# Patient Record
Sex: Male | Born: 1935 | Race: White | Hispanic: No | Marital: Single | Smoking: Current every day smoker
Health system: Southern US, Community
[De-identification: ages and names within clinical notes are randomized; demographics above are authoritative.]

## PROBLEM LIST (undated history)

## (undated) DIAGNOSIS — E785 Hyperlipidemia, unspecified: Secondary | ICD-10-CM

## (undated) DIAGNOSIS — E039 Hypothyroidism, unspecified: Secondary | ICD-10-CM

## (undated) DIAGNOSIS — I2510 Atherosclerotic heart disease of native coronary artery without angina pectoris: Secondary | ICD-10-CM

## (undated) DIAGNOSIS — I10 Essential (primary) hypertension: Secondary | ICD-10-CM

## (undated) DIAGNOSIS — T18128A Food in esophagus causing other injury, initial encounter: Secondary | ICD-10-CM

## (undated) DIAGNOSIS — E119 Type 2 diabetes mellitus without complications: Secondary | ICD-10-CM

## (undated) DIAGNOSIS — K2270 Barrett's esophagus without dysplasia: Secondary | ICD-10-CM

## (undated) HISTORY — PX: FOOT SURGERY: SHX648

## (undated) HISTORY — DX: Essential (primary) hypertension: I10

## (undated) HISTORY — DX: Type 2 diabetes mellitus without complications: E11.9

## (undated) HISTORY — PX: CHOLECYSTECTOMY: SHX55

## (undated) HISTORY — DX: Atherosclerotic heart disease of native coronary artery without angina pectoris: I25.10

## (undated) HISTORY — DX: Hypothyroidism, unspecified: E03.9

## (undated) HISTORY — DX: Barrett's esophagus without dysplasia: K22.70

## (undated) HISTORY — DX: Hyperlipidemia, unspecified: E78.5

## (undated) HISTORY — PX: TONSILLECTOMY: SUR1361

---

## 1991-08-15 HISTORY — PX: CORONARY ARTERY BYPASS GRAFT: SHX141

## 1998-03-10 ENCOUNTER — Inpatient Hospital Stay (HOSPITAL_COMMUNITY): Admission: EM | Admit: 1998-03-10 | Discharge: 1998-03-13 | Payer: Self-pay | Admitting: Emergency Medicine

## 2001-05-16 ENCOUNTER — Encounter: Admission: RE | Admit: 2001-05-16 | Discharge: 2001-08-14 | Payer: Self-pay | Admitting: Internal Medicine

## 2003-01-20 ENCOUNTER — Encounter (HOSPITAL_BASED_OUTPATIENT_CLINIC_OR_DEPARTMENT_OTHER): Admission: RE | Admit: 2003-01-20 | Discharge: 2003-02-10 | Payer: Self-pay | Admitting: Internal Medicine

## 2003-04-30 ENCOUNTER — Encounter (HOSPITAL_BASED_OUTPATIENT_CLINIC_OR_DEPARTMENT_OTHER): Admission: RE | Admit: 2003-04-30 | Discharge: 2003-05-13 | Payer: Self-pay | Admitting: Internal Medicine

## 2003-08-15 HISTORY — PX: COLONOSCOPY W/ POLYPECTOMY: SHX1380

## 2003-08-24 ENCOUNTER — Encounter (HOSPITAL_BASED_OUTPATIENT_CLINIC_OR_DEPARTMENT_OTHER): Admission: RE | Admit: 2003-08-24 | Discharge: 2003-09-01 | Payer: Self-pay | Admitting: Internal Medicine

## 2003-11-24 ENCOUNTER — Encounter (HOSPITAL_BASED_OUTPATIENT_CLINIC_OR_DEPARTMENT_OTHER): Admission: RE | Admit: 2003-11-24 | Discharge: 2003-12-08 | Payer: Self-pay | Admitting: Internal Medicine

## 2004-03-02 ENCOUNTER — Encounter (HOSPITAL_BASED_OUTPATIENT_CLINIC_OR_DEPARTMENT_OTHER): Admission: RE | Admit: 2004-03-02 | Discharge: 2004-03-18 | Payer: Self-pay | Admitting: Internal Medicine

## 2004-06-07 ENCOUNTER — Encounter (HOSPITAL_BASED_OUTPATIENT_CLINIC_OR_DEPARTMENT_OTHER): Admission: RE | Admit: 2004-06-07 | Discharge: 2004-06-20 | Payer: Self-pay | Admitting: Internal Medicine

## 2004-08-26 ENCOUNTER — Ambulatory Visit: Payer: Self-pay | Admitting: Cardiology

## 2004-09-13 ENCOUNTER — Encounter (HOSPITAL_BASED_OUTPATIENT_CLINIC_OR_DEPARTMENT_OTHER): Admission: RE | Admit: 2004-09-13 | Discharge: 2004-09-28 | Payer: Self-pay | Admitting: Internal Medicine

## 2005-05-09 ENCOUNTER — Ambulatory Visit: Payer: Self-pay | Admitting: Internal Medicine

## 2005-05-16 ENCOUNTER — Ambulatory Visit: Payer: Self-pay | Admitting: Internal Medicine

## 2005-06-01 ENCOUNTER — Ambulatory Visit: Payer: Self-pay | Admitting: Cardiology

## 2005-06-06 ENCOUNTER — Ambulatory Visit: Payer: Self-pay

## 2005-08-17 ENCOUNTER — Ambulatory Visit: Payer: Self-pay | Admitting: Cardiology

## 2005-08-21 ENCOUNTER — Ambulatory Visit: Payer: Self-pay | Admitting: Cardiology

## 2005-08-23 ENCOUNTER — Ambulatory Visit: Payer: Self-pay | Admitting: Cardiology

## 2005-08-29 ENCOUNTER — Ambulatory Visit: Payer: Self-pay | Admitting: Cardiology

## 2005-08-29 ENCOUNTER — Inpatient Hospital Stay (HOSPITAL_BASED_OUTPATIENT_CLINIC_OR_DEPARTMENT_OTHER): Admission: RE | Admit: 2005-08-29 | Discharge: 2005-08-29 | Payer: Self-pay | Admitting: Cardiology

## 2005-09-01 ENCOUNTER — Ambulatory Visit: Payer: Self-pay | Admitting: Cardiology

## 2005-09-01 ENCOUNTER — Ambulatory Visit (HOSPITAL_COMMUNITY): Admission: RE | Admit: 2005-09-01 | Discharge: 2005-09-02 | Payer: Self-pay | Admitting: Cardiology

## 2005-09-21 ENCOUNTER — Encounter (HOSPITAL_COMMUNITY): Admission: RE | Admit: 2005-09-21 | Discharge: 2005-12-20 | Payer: Self-pay | Admitting: Cardiology

## 2006-06-21 ENCOUNTER — Ambulatory Visit: Payer: Self-pay | Admitting: Internal Medicine

## 2006-06-21 LAB — CONVERTED CEMR LAB
ALT: 18 units/L (ref 0–40)
HDL: 37.7 mg/dL — ABNORMAL LOW (ref >39.0)
Hgb A1c MFr Bld: 6.8 % — ABNORMAL HIGH (ref 4.6–6.0)
Potassium: 3.8 meq/L (ref 3.5–5.1)
Triglyceride fasting, serum: 126 mg/dL (ref 0–149)

## 2006-07-02 ENCOUNTER — Ambulatory Visit: Payer: Self-pay | Admitting: Cardiology

## 2006-08-13 ENCOUNTER — Ambulatory Visit: Payer: Self-pay | Admitting: Cardiology

## 2006-08-14 HISTORY — PX: ANGIOPLASTY: SHX39

## 2007-04-26 ENCOUNTER — Ambulatory Visit: Payer: Self-pay | Admitting: Internal Medicine

## 2007-04-26 DIAGNOSIS — E785 Hyperlipidemia, unspecified: Secondary | ICD-10-CM

## 2007-04-26 DIAGNOSIS — I2510 Atherosclerotic heart disease of native coronary artery without angina pectoris: Secondary | ICD-10-CM | POA: Insufficient documentation

## 2007-05-15 ENCOUNTER — Encounter: Payer: Self-pay | Admitting: Internal Medicine

## 2007-06-03 ENCOUNTER — Encounter: Payer: Self-pay | Admitting: Internal Medicine

## 2007-09-10 ENCOUNTER — Encounter: Payer: Self-pay | Admitting: Internal Medicine

## 2007-12-06 ENCOUNTER — Ambulatory Visit: Payer: Self-pay | Admitting: Internal Medicine

## 2007-12-06 ENCOUNTER — Inpatient Hospital Stay (HOSPITAL_COMMUNITY): Admission: EM | Admit: 2007-12-06 | Discharge: 2007-12-16 | Payer: Self-pay | Admitting: Emergency Medicine

## 2007-12-11 ENCOUNTER — Encounter (INDEPENDENT_AMBULATORY_CARE_PROVIDER_SITE_OTHER): Payer: Self-pay | Admitting: General Surgery

## 2008-01-02 ENCOUNTER — Encounter: Payer: Self-pay | Admitting: Internal Medicine

## 2008-01-16 ENCOUNTER — Encounter: Payer: Self-pay | Admitting: Internal Medicine

## 2008-01-21 ENCOUNTER — Telehealth (INDEPENDENT_AMBULATORY_CARE_PROVIDER_SITE_OTHER): Payer: Self-pay | Admitting: *Deleted

## 2008-01-27 ENCOUNTER — Ambulatory Visit: Payer: Self-pay | Admitting: Family Medicine

## 2008-01-27 DIAGNOSIS — E039 Hypothyroidism, unspecified: Secondary | ICD-10-CM | POA: Insufficient documentation

## 2008-01-27 LAB — HM DIABETES FOOT EXAM

## 2008-01-30 ENCOUNTER — Ambulatory Visit: Payer: Self-pay | Admitting: Internal Medicine

## 2008-02-03 ENCOUNTER — Telehealth (INDEPENDENT_AMBULATORY_CARE_PROVIDER_SITE_OTHER): Payer: Self-pay | Admitting: *Deleted

## 2008-02-03 LAB — CONVERTED CEMR LAB
AST: 19 units/L (ref 0–37)
BUN: 13 mg/dL (ref 6–23)
Calcium: 9.5 mg/dL (ref 8.4–10.5)
Creatinine, Ser: 1 mg/dL (ref 0.4–1.5)
Glucose, Bld: 100 mg/dL — ABNORMAL HIGH (ref 70–99)
HDL: 32.7 mg/dL — ABNORMAL LOW (ref >39.0)
Potassium: 4.6 meq/L (ref 3.5–5.1)
Sodium: 141 meq/L (ref 135–145)
TSH: 18.2 microintl units/mL — ABNORMAL HIGH (ref 0.35–5.50)
Total CHOL/HDL Ratio: 9
Total Protein: 7.3 g/dL (ref 6.0–8.3)

## 2008-03-29 ENCOUNTER — Encounter: Payer: Self-pay | Admitting: Internal Medicine

## 2008-04-02 ENCOUNTER — Ambulatory Visit: Payer: Self-pay | Admitting: Internal Medicine

## 2008-04-02 DIAGNOSIS — L719 Rosacea, unspecified: Secondary | ICD-10-CM | POA: Insufficient documentation

## 2008-04-06 ENCOUNTER — Encounter (INDEPENDENT_AMBULATORY_CARE_PROVIDER_SITE_OTHER): Payer: Self-pay | Admitting: *Deleted

## 2008-04-06 LAB — CONVERTED CEMR LAB
ALT: 16 units/L (ref 0–53)
AST: 17 units/L (ref 0–37)
Bilirubin, Direct: 0.1 mg/dL (ref 0.0–0.3)
HDL: 37.1 mg/dL — ABNORMAL LOW (ref >39.0)
TSH: 23.85 microintl units/mL — ABNORMAL HIGH (ref 0.35–5.50)
Total Bilirubin: 0.5 mg/dL (ref 0.3–1.2)
Triglycerides: 155 mg/dL — ABNORMAL HIGH (ref 0–149)
VLDL: 31 mg/dL (ref 0–40)

## 2008-04-07 ENCOUNTER — Encounter: Payer: Self-pay | Admitting: Internal Medicine

## 2008-04-21 ENCOUNTER — Encounter: Payer: Self-pay | Admitting: Internal Medicine

## 2008-06-18 ENCOUNTER — Encounter: Payer: Self-pay | Admitting: Internal Medicine

## 2008-07-14 ENCOUNTER — Telehealth (INDEPENDENT_AMBULATORY_CARE_PROVIDER_SITE_OTHER): Payer: Self-pay | Admitting: *Deleted

## 2008-07-31 ENCOUNTER — Encounter: Payer: Self-pay | Admitting: Internal Medicine

## 2008-08-11 ENCOUNTER — Ambulatory Visit: Payer: Self-pay | Admitting: Internal Medicine

## 2008-08-18 ENCOUNTER — Encounter (INDEPENDENT_AMBULATORY_CARE_PROVIDER_SITE_OTHER): Payer: Self-pay | Admitting: *Deleted

## 2008-08-18 LAB — CONVERTED CEMR LAB
BUN: 19 mg/dL (ref 6–23)
Creatinine, Ser: 1.1 mg/dL (ref 0.4–1.5)

## 2008-09-08 ENCOUNTER — Ambulatory Visit: Payer: Self-pay | Admitting: Internal Medicine

## 2008-09-08 DIAGNOSIS — E1165 Type 2 diabetes mellitus with hyperglycemia: Secondary | ICD-10-CM

## 2008-10-27 ENCOUNTER — Ambulatory Visit: Payer: Self-pay | Admitting: Internal Medicine

## 2008-11-02 ENCOUNTER — Encounter (INDEPENDENT_AMBULATORY_CARE_PROVIDER_SITE_OTHER): Payer: Self-pay | Admitting: *Deleted

## 2008-11-02 LAB — CONVERTED CEMR LAB: TSH: 2.69 microintl units/mL (ref 0.35–5.50)

## 2008-11-03 ENCOUNTER — Ambulatory Visit: Payer: Self-pay | Admitting: Internal Medicine

## 2008-11-04 ENCOUNTER — Telehealth (INDEPENDENT_AMBULATORY_CARE_PROVIDER_SITE_OTHER): Payer: Self-pay | Admitting: *Deleted

## 2008-11-17 ENCOUNTER — Encounter: Payer: Self-pay | Admitting: Internal Medicine

## 2008-12-14 ENCOUNTER — Telehealth (INDEPENDENT_AMBULATORY_CARE_PROVIDER_SITE_OTHER): Payer: Self-pay | Admitting: *Deleted

## 2009-01-06 ENCOUNTER — Encounter: Payer: Self-pay | Admitting: Internal Medicine

## 2009-01-12 ENCOUNTER — Encounter: Payer: Self-pay | Admitting: Internal Medicine

## 2009-03-05 ENCOUNTER — Telehealth (INDEPENDENT_AMBULATORY_CARE_PROVIDER_SITE_OTHER): Payer: Self-pay | Admitting: *Deleted

## 2009-03-08 ENCOUNTER — Encounter: Payer: Self-pay | Admitting: Internal Medicine

## 2009-04-21 ENCOUNTER — Encounter: Payer: Self-pay | Admitting: Internal Medicine

## 2009-04-23 ENCOUNTER — Encounter: Payer: Self-pay | Admitting: Internal Medicine

## 2009-09-01 ENCOUNTER — Encounter: Payer: Self-pay | Admitting: Internal Medicine

## 2009-09-21 ENCOUNTER — Encounter: Payer: Self-pay | Admitting: Internal Medicine

## 2009-09-24 ENCOUNTER — Ambulatory Visit: Payer: Self-pay | Admitting: Internal Medicine

## 2009-09-24 DIAGNOSIS — R079 Chest pain, unspecified: Secondary | ICD-10-CM

## 2009-09-24 DIAGNOSIS — Z860100 Personal history of colon polyps, unspecified: Secondary | ICD-10-CM | POA: Insufficient documentation

## 2009-09-24 DIAGNOSIS — F17200 Nicotine dependence, unspecified, uncomplicated: Secondary | ICD-10-CM

## 2009-09-28 ENCOUNTER — Encounter (INDEPENDENT_AMBULATORY_CARE_PROVIDER_SITE_OTHER): Payer: Self-pay | Admitting: *Deleted

## 2009-10-21 ENCOUNTER — Ambulatory Visit: Payer: Self-pay | Admitting: Cardiology

## 2009-10-21 DIAGNOSIS — E663 Overweight: Secondary | ICD-10-CM | POA: Insufficient documentation

## 2009-10-26 ENCOUNTER — Telehealth (INDEPENDENT_AMBULATORY_CARE_PROVIDER_SITE_OTHER): Payer: Self-pay | Admitting: *Deleted

## 2009-10-26 ENCOUNTER — Encounter: Payer: Self-pay | Admitting: Internal Medicine

## 2009-10-27 ENCOUNTER — Encounter (HOSPITAL_COMMUNITY): Admission: RE | Admit: 2009-10-27 | Discharge: 2010-01-12 | Payer: Self-pay | Admitting: Cardiology

## 2009-10-27 ENCOUNTER — Ambulatory Visit: Payer: Self-pay

## 2009-10-27 ENCOUNTER — Ambulatory Visit: Payer: Self-pay | Admitting: Cardiology

## 2009-11-01 ENCOUNTER — Ambulatory Visit: Payer: Self-pay | Admitting: Internal Medicine

## 2009-12-23 ENCOUNTER — Encounter: Payer: Self-pay | Admitting: Internal Medicine

## 2009-12-30 ENCOUNTER — Encounter: Payer: Self-pay | Admitting: Internal Medicine

## 2010-01-05 ENCOUNTER — Encounter: Payer: Self-pay | Admitting: Internal Medicine

## 2010-01-07 ENCOUNTER — Ambulatory Visit: Payer: Self-pay | Admitting: Internal Medicine

## 2010-01-08 LAB — CONVERTED CEMR LAB
AST: 21 units/L (ref 0–37)
Alkaline Phosphatase: 65 units/L (ref 39–117)
Bilirubin, Direct: 0.1 mg/dL (ref 0.0–0.3)
Cholesterol: 169 mg/dL (ref 0–200)
HDL: 39.1 mg/dL (ref >39.00)
VLDL: 32.4 mg/dL (ref 0.0–40.0)

## 2010-01-13 ENCOUNTER — Ambulatory Visit: Payer: Self-pay | Admitting: Internal Medicine

## 2010-04-07 ENCOUNTER — Encounter: Payer: Self-pay | Admitting: Internal Medicine

## 2010-05-24 ENCOUNTER — Encounter: Payer: Self-pay | Admitting: Internal Medicine

## 2010-05-26 ENCOUNTER — Encounter: Payer: Self-pay | Admitting: Internal Medicine

## 2010-06-03 ENCOUNTER — Encounter: Payer: Self-pay | Admitting: Internal Medicine

## 2010-06-27 ENCOUNTER — Encounter: Payer: Self-pay | Admitting: Internal Medicine

## 2010-07-20 ENCOUNTER — Encounter: Payer: Self-pay | Admitting: Internal Medicine

## 2010-07-25 ENCOUNTER — Encounter: Payer: Self-pay | Admitting: Internal Medicine

## 2010-08-09 ENCOUNTER — Emergency Department (HOSPITAL_COMMUNITY)
Admission: EM | Admit: 2010-08-09 | Discharge: 2010-08-09 | Payer: Self-pay | Source: Home / Self Care | Admitting: Emergency Medicine

## 2010-08-10 ENCOUNTER — Ambulatory Visit
Admission: RE | Admit: 2010-08-10 | Discharge: 2010-08-10 | Payer: Self-pay | Source: Home / Self Care | Attending: Internal Medicine | Admitting: Internal Medicine

## 2010-08-10 DIAGNOSIS — R259 Unspecified abnormal involuntary movements: Secondary | ICD-10-CM | POA: Insufficient documentation

## 2010-09-11 LAB — CONVERTED CEMR LAB
AST: 25 units/L (ref 0–37)
BUN: 21 mg/dL (ref 6–23)
Basophils Absolute: 0.1 10*3/uL (ref 0.0–0.1)
Eosinophils Relative: 2 % (ref 0–5)
Hemoglobin: 13.7 g/dL (ref 13.0–17.0)
Hgb A1c MFr Bld: 7.5 % — ABNORMAL HIGH (ref 4.6–6.1)
Lymphocytes Relative: 27 % (ref 12–46)
Lymphs Abs: 1.8 10*3/uL (ref 0.7–4.0)
Monocytes Absolute: 0.6 10*3/uL (ref 0.1–1.0)
Monocytes Relative: 9 % (ref 3–12)
Neutrophils Relative %: 61 % (ref 43–77)
TSH: 6.095 microintl units/mL — ABNORMAL HIGH (ref 0.350–4.500)
Triglycerides: 251 mg/dL — ABNORMAL HIGH (ref <150)

## 2010-09-15 NOTE — Medication Information (Signed)
Summary: [facility]   Imported By: Lanelle Bal 09/30/2009 09:46:03  _____________________________________________________________________  External Attachment:    Type:   Image     Comment:   External Document

## 2010-09-15 NOTE — Miscellaneous (Signed)
Summary: Care Plan/[facility]   Imported By: Lanelle Bal 08/04/2010 08:48:37  _____________________________________________________________________  External Attachment:    Type:   Image     Comment:   External Document

## 2010-09-15 NOTE — Progress Notes (Signed)
Summary: Nuclear Pre-Procedure  Phone Note Outgoing Call   Call placed by: Milana Na, EMT-P,  October 26, 2009 1:10 PM Summary of Call: Reviewed information on Myoview Information Sheet (see scanned document for further details).  Spoke with patient.     Nuclear Med Background Indications for Stress Test: Evaluation for Ischemia, Graft Patency   History: Angioplasty, CABG, Heart Catheterization, Myocardial Perfusion Study  History Comments: '93  CABG x 2 10/06 MPS EF 64% ? Anterior  ischemia '07 Angioplasty/ Heart Cath EF 65%  Symptoms: Chest Pain, Chest Pain with Exertion    Nuclear Pre-Procedure Cardiac Risk Factors: Lipids, Smoker Height (in): 70  Nuclear Med Study 1 or 2 day study:  1 day     Referring MD:  J.Hochrein

## 2010-09-15 NOTE — Miscellaneous (Signed)
Summary: Diet Order/[facility]  Diet Order/[facility]   Imported By: Lanelle Bal 06/13/2010 14:19:20  _____________________________________________________________________  External Attachment:    Type:   Image     Comment:   External Document

## 2010-09-15 NOTE — Letter (Signed)
Summary: [facility]  [facility]   Imported By: Lanelle Bal 06/06/2010 14:26:54  _____________________________________________________________________  External Attachment:    Type:   Image     Comment:   External Document

## 2010-09-15 NOTE — Letter (Signed)
Summary: [facility]   Imported By: Lanelle Bal 09/30/2009 08:09:17  _____________________________________________________________________  External Attachment:    Type:   Image     Comment:   External Document

## 2010-09-15 NOTE — Miscellaneous (Signed)
Summary: FL2/[facility]   Imported By: Lanelle Bal 06/13/2010 14:17:09  _____________________________________________________________________  External Attachment:    Type:   Image     Comment:   External Document

## 2010-09-15 NOTE — Assessment & Plan Note (Signed)
Summary: f/u lab work//lch   Vital Signs:  Patient profile:   76 year old male Weight:      210.2 pounds Pulse rate:   64 / minute Resp:     18 per minute BP sitting:   118 / 66  (left arm) Cuff size:   large  Vitals Entered By: Shonna Chock (January 13, 2010 10:13 AM) CC: Follow-up on Labs (Copy Given) Comments REVIEWED MED LIST, PATIENT AGREED DOSE AND INSTRUCTION CORRECT    Primary Care Provider:  Marga Melnick MD  CC:  Follow-up on Labs (Copy Given).  History of Present Illness: FBS 140-170; rare  2 hr post meal up to 240. No hypoglycemia; weight stable.No diet ; occasioanal soft drinks. Smoking 4-6 / day; minimal CVE.Labs reviewed & risks discussed; Lipids are essentially @ goal but DM poorly controlled with A1c 7.9% which is average sugar of 180 & 58% increased cardiovascular  risk. Dr Hochrein's 03/11 OV & stress test reviewed: no ischemic changes  found.   Allergies (verified): No Known Drug Allergies  Review of Systems General:  Denies fatigue and weight loss. Eyes:  Denies blurring, double vision, and vision loss-both eyes; No Ophth exam > 1+ years. CV:  Denies chest pain or discomfort, leg cramps with exertion, lightheadness, and near fainting. Derm:  Denies poor wound healing; Podiatrist seen every 2 months. Neuro:  Denies disturbances in coordination, numbness, poor balance, and tingling. Endo:  Denies excessive hunger, excessive thirst, and excessive urination.  Physical Exam  General:  in no acute distress; alert,appropriate and cooperative throughout examination Ears:  External ear exam shows no significant lesions or deformities.  Hearing is grossly decreased  bilaterally. Aid worn on R Lungs:  Normal respiratory effort, chest expands symmetrically. Lungs are clear to auscultation, no crackles or wheezes. Heart:  regular rhythm, no gallop, no rub, no JVD, bradycardia, and grade 1 /6 systolic murmur.   Pulses:  R and L carotid,radial,dorsalis pedis and posterior  tibial pulses are full and equal bilaterally Extremities:  No clubbing, cyanosis  noted . Chronic toe  deformities LLE > RLE with toenail fungal changes Neurologic:  alert & oriented X3 and sensation intact to light touch over feet   Skin:  Intact without suspicious lesions or rashes Cervical Nodes:  No lymphadenopathy noted Axillary Nodes:  No palpable lymphadenopathy Psych:  memory intact for recent and remote, normally interactive, and good eye contact.     Impression & Recommendations:  Problem # 1:  DIABETES MELLITUS, TYPE II, UNCONTROLLED (ICD-250.02) Role of HFCS "sugar" discussed His updated medication list for this problem includes:    Glimepiride 2 Mg Tabs (Glimepiride) .Marland Kitchen... 1 by mouth once daily    Metformin Hcl 1000 Mg Tabs (Metformin hcl) .Marland Kitchen... 1 by mouth bid    Benazepril Hcl 20 Mg Tabs (Benazepril hcl) .Marland Kitchen... Take one tablet by mouth daily  Problem # 2:  HYPERLIPIDEMIA NEC/NOS (ICD-272.4) Good control His updated medication list for this problem includes:    Simvastatin 40 Mg Tabs (Simvastatin) .Marland Kitchen... 1 at bedtime in place of pravastatin  Problem # 3:  HYPOTHYROIDISM (ICD-244.9) TSH therapeutic His updated medication list for this problem includes:    Synthroid 100 Mcg Tabs (Levothyroxine sodium) .Marland Kitchen... 1 by mouth once daily except 1& 1/2 every weds  Problem # 4:  C A D (ICD-414.00) Stable His updated medication list for this problem includes:    Atenolol 25 Mg Tabs (Atenolol) .Marland Kitchen... Take one tablet daily    Benazepril Hcl 20 Mg  Tabs (Benazepril hcl) .Marland Kitchen... Take one tablet by mouth daily  Complete Medication List: 1)  Glimepiride 2 Mg Tabs (Glimepiride) .Marland Kitchen.. 1 by mouth once daily 2)  Metformin Hcl 1000 Mg Tabs (Metformin hcl) .Marland Kitchen.. 1 by mouth bid 3)  Atenolol 25 Mg Tabs (Atenolol) .... Take one tablet daily 4)  Benazepril Hcl 20 Mg Tabs (Benazepril hcl) .... Take one tablet by mouth daily 5)  Synthroid 100 Mcg Tabs (Levothyroxine sodium) .Marland Kitchen.. 1 by mouth once daily  except 1& 1/2 every weds 6)  Metrogel 1 % Gel (Metronidazole) .... Apply o rash once daily after cleansing; patient may apply 7)  Pancrease Mt 4 07-18-11 Mu Cpep (Amylase-lipase-protease) .Marland Kitchen.. 1 pill before breakfast to prevent diarrhea 8)  Simvastatin 40 Mg Tabs (Simvastatin) .Marland Kitchen.. 1 at bedtime in place of pravastatin  Patient Instructions: 1)  Consume LESS THAN 40 grams of High Fructose Corn Syrup "sugar"/ day  if you want to avoid increase in meds or even insulin as discussed. 2)  Please schedule a follow-up appointment in 3 months. 3)  BUN,creat, K+ prior to visit, ICD-9: 4)  HbgA1C prior to visit, ICD-9: 5)  Urine Microalbumin prior to visit, ICD-9:

## 2010-09-15 NOTE — Miscellaneous (Signed)
Summary: Diet Order/[facility]  Diet Order/[facility]   Imported By: Lanelle Bal 01/06/2010 12:14:21  _____________________________________________________________________  External Attachment:    Type:   Image     Comment:   External Document

## 2010-09-15 NOTE — Letter (Signed)
Summary: Primary Care Consult Scheduled Letter  [facility] [place]  [address] [place], [place] [address]   Phone: [phone]  Fax: [phone]      09/28/2009 MRN: [id number]  [address] [place], [place]  [address]    Dear Mr. TONY,    We have scheduled an appointment for you.  At the recommendation of Dr. Marga Melnick, we have scheduled you a consult with Dr. Rollene Rotunda with [facility] on 10-21-2009 at 4:15pm.  Their address is [address], [address] floor, [place] [address]. The office phone number is [phone].  If this appointment day and time is not convenient for you, please feel free to call the office of the doctor you are being referred to at the number listed above and reschedule the appointment.    It is important for you to keep your scheduled appointments. We are here to make sure you are given good patient care.   Thank you,    Renee, Patient Care Coordinator [facility] [place]    **IF YOU ARE UNABLE TO KEEP THIS APPOINTMENT, OR NEED TO RESCHEDULE, PLEASE GIVE A 24 HOUR NOTICE TO AVOID A $50 FEE**

## 2010-09-15 NOTE — Assessment & Plan Note (Signed)
Summary: f3y/chest pain   Visit Type:  Follow-up Primary Provider:  Marga Melnick MD  CC:  chest pain .  History of Present Illness: The patient presents after being absent from this clinic for over 3 years. He has a history of coronary disease status post bypass grafting. Her last catheterization was in 2007 at which point he had cutting balloon angioplasty. Since that time he had not had any problems until about a year ago. He started noticing that with ambulation he would have some discomfort in his lower chest upper epigastric area. It is not clear that this was similar to previous angina. However, it was reproducible with exertion. He would stop when he was rested. He was not getting these symptoms without exertion. He did not describe radiation to his jaw or to his arms. He did not have associated symptoms such as nausea vomiting or diaphoresis. He's had no palpitations, presyncope or syncope.  Of note he says he had a significant episode of hypoglycemia and unresponsiveness about 2 years ago requiring that he moved to assisted living though he is quite independent there.  Current Medications (verified): 1)  Glimepiride 2 Mg Tabs (Glimepiride) .Marland Kitchen.. 1 By Mouth Once Daily 2)  Metformin Hcl 1000 Mg Tabs (Metformin Hcl) .Marland Kitchen.. 1 By Mouth Bid 3)  Atenolol 25 Mg  Tabs (Atenolol) .... Take One Tablet Daily 4)  Benazepril Hcl 20 Mg  Tabs (Benazepril Hcl) .... Take One Tablet By Mouth Daily 5)  Synthroid 100 Mcg Tabs (Levothyroxine Sodium) .Marland Kitchen.. 1 By Mouth Once Daily Except 1& 1/2 Every Weds 6)  Metrogel 1 % Gel (Metronidazole) .... Apply O Rash Once Daily After Cleansing; Patient May Apply 7)  Pancrease Mt 4 07-18-11 Mu Cpep (Amylase-Lipase-Protease) .Marland Kitchen.. 1 Pill Before Breakfast To Prevent Diarrhea 8)  Simvastatin 40 Mg Tabs (Simvastatin) .Marland Kitchen.. 1 At Bedtime in Place of Pravastatin  Allergies (verified): No Known Drug Allergies  Past History:  Past Medical History: Reviewed history from  10/21/2009 and no changes required. HYPERLIPIDEMIA NEC/NOS (ICD-272.4) DIABETES-TYPE 2 (ICD-250.00) C A D (ICD-414.00)(LAD  occluded and seen to fill via the LIMA, LAD was free of high-grade disease,  diagonal was occluded at the ostium and seen to fill the other vein graft,  the circumflex was occluded proximally at the left main. There was a large  branching mid obtuse that was noted to fill the vein graft. The inferior  branch which looked to run in  AV groove had a proximal 95% stenosis. The  right coronary had a long proximal 60% stenosis. There was a mid 99%  followed by a total occlusion of the RV branch. There was a large PDA filled  via collaterals from the LAD. The LIMA to the LAD was patent, saphenous vein  graft to the coronary artery was occluded at the ostium, saphenous vein  graft to the obtuse marginal had diffuse liminal irregularities with a 99%  anastomotic lesion. This was treated with cutting balloon. The saphenous  vein graft to diagonal had a hazy area with about a 50% stenosis. The EF was  65%) Hypothyroidism Colonic polyps, hx of Congenital L foot deformities  Family History: Reviewed history from 09/24/2009 and no changes required. Father: Macular Degeneration , CABG X 2 Mother: Alzeimer's Siblings: bro +/- DM  Social History: No diet Retired Single Current Smoker Alcohol use-yes:occasionally  Review of Systems       Positive for decreased hearing. Otherwise as stated in the history of present illness negative for all other systems.  Vital Signs:  Patient profile:   76 year old male Height:      70 inches Weight:      209 pounds BMI:     30.10 Pulse rate:   70 / minute Resp:     16 per minute BP sitting:   124 / 63  (left arm)  Vitals Entered By: Burnett Kanaris, CNA (October 21, 2009 4:27 PM)  Physical Exam  General:  Well developed, well nourished, in no acute distress. Head:  normocephalic and atraumatic Eyes:  PERRLA/EOM intact;  conjunctiva and lids normal. Mouth:  Teeth, gums and palate normal. Oral mucosa normal. Neck:  Neck supple, no JVD. No masses, thyromegaly or abnormal cervical nodes. Chest Wall:  Well-healed sternotomy scar Lungs:  Clear bilaterally to auscultation and percussion. Abdomen:  Bowel sounds positive; abdomen soft and non-tender without masses, organomegaly, or hernias noted. No hepatosplenomegaly, obese Msk:  Back normal, normal gait. Muscle strength and tone normal. Extremities:  No clubbing or cyanosis, left foot with congenital deformities Neurologic:  Alert and oriented x 3. Skin:  Intact without lesions or rashes. Cervical Nodes:  no significant adenopathy Inguinal Nodes:  no significant adenopathy Psych:  Normal affect.   Detailed Cardiovascular Exam  Neck    Carotids: Carotids full and equal bilaterally without bruits.      Neck Veins: Normal, no JVD.    Heart    Inspection: no deformities or lifts noted.      Palpation: normal PMI with no thrills palpable.      Auscultation: regular rate and rhythm, S1, S2 without murmurs, rubs, gallops, or clicks.    Vascular    Abdominal Aorta: no palpable masses, pulsations, or audible bruits.      Femoral Pulses: normal femoral pulses bilaterally.      Pedal Pulses: normal pedal pulses bilaterally.      Radial Pulses: normal radial pulses bilaterally.      Peripheral Circulation: no clubbing, cyanosis, or edema noted with normal capillary refill.     Impression & Recommendations:  Problem # 1:  CHEST PAIN (ICD-786.50) The patient has a long history of coronary disease as described. It has been several years since his last evaluation. He has recurrent symptoms which could be his angina. He would find it difficult to walk on a treadmill. Therefore, I will schedule an adenosine perfusion study. Further evaluation will be based on these results. Orders: EKG w/ Interpretation (93000) Nuclear Stress Test (Nuc Stress Test)  Problem # 2:   SMOKER (ICD-305.1) Dr. Alwyn Ren recently spent several minutes discussing the need to stop smoking with this patient. He understands how important this is.  Problem # 3:  HYPERLIPIDEMIA NEC/NOS (ICD-272.4) I reviewed his lipid profile with him. His HDL is slightly below 40, LDL is in the 80s. I asked him to increase his exercise to improve his HDL. He will continue the other meds as listed.  Problem # 4:  OVERWEIGHT (ICD-278.02) I discussed with him the need to lose weight.  Patient Instructions: 1)  Your physician wants you to follow-up in: 1 year.  You will receive a reminder letter in the mail two months in advance. If you don't receive a letter, please call our office to schedule the follow-up appointment. 2)  Your physician has requested that you have an adenosine myoview.  For further information please visit [email or web address]  Please follow instruction sheet, as given.  Appended Document: f3y/chest pain No evidence of ischemia.

## 2010-09-15 NOTE — Assessment & Plan Note (Signed)
Summary: COLD SYMPTOMS/SWOLLEN GLAND/KN   Vital Signs:  Patient profile:   76 year old male Weight:      206.2 pounds BMI:     29.69 O2 Sat:      95 % on Room air Temp:     97.6 degrees F oral Pulse rate:   76 / minute Resp:     17 per minute BP sitting:   130 / 72  (left arm) Cuff size:   large  Vitals Entered By: Shonna Chock CMA (August 10, 2010 4:35 PM)  O2 Flow:  Room air CC: Patient was seen at [facility] yesterday for URI, here today for follow-up, Cough   Primary Care Provider:  Marga Melnick MD  CC:  Patient was seen at [facility] yesterday for URI, here today for follow-up, and Cough.  History of Present Illness:      This is a 76 year old man who presents with Cough for which he was seen in ER 12/27. ER records were reviewed: WBC 5800; CXray revealed no PNA. Glucose was 68; 127 after sugar supplement.Rx: neb treatment, prednisone  & Zpack  The patient reports non-productive cough, but denies pleuritic chest pain, shortness of breath, wheezing, fever, and hemoptysis.  Associated symtpoms include nasal congestion.  The patient denies the following symptoms: cold/URI symptoms and sore throat.    Current Medications (verified): 1)  Glimepiride 2 Mg Tabs (Glimepiride) .Marland Kitchen.. 1 By Mouth Once Daily 2)  Metformin Hcl 1000 Mg Tabs (Metformin Hcl) .Marland Kitchen.. 1 By Mouth Bid 3)  Atenolol 25 Mg  Tabs (Atenolol) .... Take One Tablet Daily 4)  Benazepril Hcl 20 Mg  Tabs (Benazepril Hcl) .... Take One Tablet By Mouth Daily 5)  Synthroid 100 Mcg Tabs (Levothyroxine Sodium) .Marland Kitchen.. 1 By Mouth Once Daily Except 1& 1/2 Every Weds 6)  Metrogel 1 % Gel (Metronidazole) .... Apply O Rash Once Daily After Cleansing; Patient May Apply 7)  Pancrease Mt 4 07-18-11 Mu Cpep (Amylase-Lipase-Protease) .Marland Kitchen.. 1 Pill Before Breakfast To Prevent Diarrhea 8)  Simvastatin 40 Mg Tabs (Simvastatin) .Marland Kitchen.. 1 At Bedtime in Place of Pravastatin 9)  Zithromax Z-Pak 250 Mg Tabs (Azithromycin) .... As Directed 10)   Ventolin Hfa 108 (90 Base) Mcg/act Aers (Albuterol Sulfate) .... 2 Puffs Every 4 Hours As Needed 11)  Prednisone 20 Mg Tabs (Prednisone) .Marland Kitchen.. 1 By Mouth Once Daily X 5 Days 12)  Tussin Dm 100-10 Mg/86ml Syrp (Dextromethorphan-Guaifenesin) .Marland Kitchen.. 1 Tsp By Mouth Every 4 Hours As Needed  Allergies (verified): No Known Drug Allergies  Review of Systems Neuro:  Complains of tremors; family concerned about tremor; "I don't think it is anything". Note: Ca++ & K+ were normal in ER. He drinks 1& 1/2 - 2 cups of coffee / day. He smokes 6 cig/ day.  Physical Exam  General:  in no acute distress; alert,appropriate and cooperative throughout examination Ears:  Aids bilaterally  but  hearing still impaired Nose:  External nasal examination shows no deformity or inflammation. Nasal mucosa are  dry  without lesions or exudates. Septal dislocation & deviation Mouth:  Oral mucosa and oropharynx without lesions or exudates.  Tongue dry Lungs:  Normal respiratory effort, chest expands symmetrically. Lungs : diffuse crackles, rhonchi &  wheezes. Heart:  Normal rate and regular rhythm. S1 and S2 normal without gallop, murmur, click, rub or other extra sounds. Extremities:  No clubbing, cyanosis, edema. Neurologic:  alert & oriented X3.   Slight rest tremor  Cervical Nodes:  No lymphadenopathy noted Axillary  Nodes:  No palpable lymphadenopathy Psych:  memory intact for recent and remote, normally interactive, and good eye contact.     Impression & Recommendations:  Problem # 1:  BRONCHITIS-ACUTE (ICD-466.0) RAD component His updated medication list for this problem includes:    Zithromax Z-pak 250 Mg Tabs (Azithromycin) .Marland Kitchen... As directed    Ventolin Hfa 108 (90 Base) Mcg/act Aers (Albuterol sulfate) .Marland Kitchen... 2 puffs every 4 hours as needed    Tussin Dm 100-10 Mg/89ml Syrp (Dextromethorphan-guaifenesin) .Marland Kitchen... 1 tsp by mouth every 4 hours as needed    Symbicort 160-4.5 Mcg/act Aero (Budesonide-formoterol fumarate)  .Marland Kitchen... 1-2 every 12 hrs ; gargle & spit after use  Problem # 2:  TREMOR (ICD-781.0) role of stimulants discussed  Complete Medication List: 1)  Glimepiride 2 Mg Tabs (Glimepiride) .Marland Kitchen.. 1 by mouth once daily 2)  Metformin Hcl 1000 Mg Tabs (Metformin hcl) .Marland Kitchen.. 1 by mouth bid 3)  Atenolol 25 Mg Tabs (Atenolol) .... Take one tablet daily 4)  Benazepril Hcl 20 Mg Tabs (Benazepril hcl) .... Take one tablet by mouth daily 5)  Synthroid 100 Mcg Tabs (Levothyroxine sodium) .Marland Kitchen.. 1 by mouth once daily except 1& 1/2 every weds 6)  Metrogel 1 % Gel (Metronidazole) .... Apply o rash once daily after cleansing; patient may apply 7)  Pancrease Mt 4 07-18-11 Mu Cpep (Amylase-lipase-protease) .Marland Kitchen.. 1 pill before breakfast to prevent diarrhea 8)  Simvastatin 40 Mg Tabs (Simvastatin) .Marland Kitchen.. 1 at bedtime in place of pravastatin 9)  Zithromax Z-pak 250 Mg Tabs (Azithromycin) .... As directed 10)  Ventolin Hfa 108 (90 Base) Mcg/act Aers (Albuterol sulfate) .... 2 puffs every 4 hours as needed 11)  Prednisone 20 Mg Tabs (Prednisone) .Marland Kitchen.. 1 by mouth once daily x 5 days 12)  Tussin Dm 100-10 Mg/56ml Syrp (Dextromethorphan-guaifenesin) .Marland Kitchen.. 1 tsp by mouth every 4 hours as needed 13)  Symbicort 160-4.5 Mcg/act Aero (Budesonide-formoterol fumarate) .Marland Kitchen.. 1-2 every 12 hrs ; gargle & spit after use  Patient Instructions: 1)  Avoid stimulants as discussed. A1c test needed in 6 weeks due to prednisone being Rxed for bronchitis. 2)  Drink as much fluid as you can tolerate for the next few days. 3)  Stop Smoking Tips: Choose a Quit date. Cut down before the Quit date. decide what you will do as a substitute when you feel the urge to smoke(gum,toothpick,exercise). Prescriptions: SYMBICORT 160-4.5 MCG/ACT AERO (BUDESONIDE-FORMOTEROL FUMARATE) 1-2 every 12 hrs ; gargle & spit after use  #1 x 5   Entered and Authorized by:   Marga Melnick MD   Signed by:   Marga Melnick MD on 08/10/2010   Method used:   Print then Give to Patient    RxID:   [id number]    Orders Added: 1)  Est. Patient Level III [14782]

## 2010-09-15 NOTE — Miscellaneous (Signed)
Summary: Diet Order/[facility]  Diet Order/[facility]   Imported By: Lanelle Bal 07/06/2010 12:35:43  _____________________________________________________________________  External Attachment:    Type:   Image     Comment:   External Document

## 2010-09-15 NOTE — Assessment & Plan Note (Signed)
Summary: Cardiology Nuclear Study  Nuclear Med Background Indications for Stress Test: Evaluation for Ischemia, Graft Patency   History: Angioplasty, CABG, Heart Catheterization, Myocardial Perfusion Study  History Comments: '93  CABG x 2; '06 MPS:? of slight anterior  ischemia, EF=64%; '07 CBA-SVG>OM,  EF= 65%  Symptoms: Chest Pain, Chest Pain with Exertion  Symptoms Comments: Last episode of CP:12/10, around Christmas.   Nuclear Pre-Procedure Cardiac Risk Factors: Lipids, NIDDM, Smoker Caffeine/Decaff Intake: none NPO After: 12:00 AM Lungs: Clear.  O2 Sat 98% on RA. IV 0.9% NS with Angio Cath: 22g     IV Site: (R) Forearm IV Started by: Stanton Kidney EMT-P Chest Size (in) 42     Height (in): 70 Weight (lb): 209 BMI: 30.10 Tech Comments: CBG= 171 @ 8:30 am this day, per Patient.  Nuclear Med Study 1 or 2 day study:  1 day     Stress Test Type:  Eugenie Birks Reading MD:  Olga Millers, MD     Referring MD:  Rollene Rotunda, MD Resting Radionuclide:  Technetium 12m Tetrofosmin     Resting Radionuclide Dose:  10.6 mCi  Stress Radionuclide:  Technetium 2m Tetrofosmin     Stress Radionuclide Dose:  33.0 mCi   Stress Protocol   Lexiscan: 0.4 mg   Stress Test Technologist:  Rea College CMA-N     Nuclear Technologist:  Domenic Polite CNMT  Rest Procedure  Myocardial perfusion imaging was performed at rest 45 minutes following the intravenous administration of Myoview Technetium 64m Tetrofosmin.  Stress Procedure  The patient received IV Lexiscan 0.4 mg over 15-seconds.  Myoview injected at 30-seconds.  There were no significant changes with lexiscan.  Quantitative spect images were obtained after a 45 minute delay.  QPS Raw Data Images:  Acuisition technically good; normal left ventricular size. Stress Images:  There is mild decreased uptake in the apex. Rest Images:  There is mild decreased uptake in the apex. Subtraction (SDS):  No evidence of ischemia. Transient Ischemic  Dilatation:  1.07  (Normal <1.22)  Lung/Heart Ratio:  .30  (Normal <0.45)  Quantitative Gated Spect Images QGS EDV:  82 ml QGS ESV:  28 ml QGS EF:  65 % QGS cine images:  Normal wall motion.   Overall Impression  Exercise Capacity: Lexiscan study with no exercise. BP Response: Normal blood pressure response. Clinical Symptoms: No chest pain ECG Impression: No significant ST segment change suggestive of ischemia. Overall Impression: There is mild apical thinning but no sign of scar or ischemia.  Appended Document: Cardiology Nuclear Study NL.  No evidence of ischemia.

## 2010-09-15 NOTE — Miscellaneous (Signed)
Summary: Care Plan/[facility]   Imported By: Lanelle Bal 12/31/2009 09:12:16  _____________________________________________________________________  External Attachment:    Type:   Image     Comment:   External Document

## 2010-09-15 NOTE — Assessment & Plan Note (Signed)
Summary: cpx/kdc   Vital Signs:  Patient profile:   76 year old male Height:      70.75 inches Weight:      213.4 pounds BMI:     30.08 Temp:     97.7 degrees F oral Resp:     16 per minute BP sitting:   130 / 74  (left arm) Cuff size:   large  Vitals Entered By: Shonna Chock (September 24, 2009 2:10 PM) CC: Yearly follow-up on meds  Comments REVIEWED MED LIST, PATIENT AGREED DOSE AND INSTRUCTION CORRECT    Primary Care Provider:  [facility]  CC:  Yearly follow-up on meds .  History of Present Illness: James [name] is here for med refills; he has been having exertional chest pain for the  past year, but he has not sought evaluation.No NTG use to date for the chest pain. PMH of angioplasty by Dr [name]. He continues to smoke 5-6 cigarettes /day. FBS checked almost daily; range 120-140. Rare post meal checks, unsure of values. No hypoglycemic episodes. Weight stable. No Opth exam for > 2 years. He sees Podiatrist regularly.  Preventive Screening-Counseling & Management  Alcohol-Tobacco     Smoking Status: current  Allergies (verified): No Known Drug Allergies  Past History:  Past Medical History: HYPERLIPIDEMIA NEC/NOS (ICD-272.4) DIABETES-TYPE 2 (ICD-250.00) C A D (ICD-414.00) Hypothyroidism Colonic polyps, hx of Congenital L foot deformities  Past Surgical History: Angioplasty 08/2006, Dr [name] Colon polypectomy 2005 Coronary artery bypass graft 1993 Tonsillectomy Multiple sugeries for congenital deformities L foot as child  Family History: Father: Macular Degeneration , CABG X 2 Mother: Alzeimer's Siblings: bro +/- DM  Social History: no diet Retired Single Current Smoker Alcohol use-yes:occasionally  Review of Systems General:  Denies fatigue and weight loss. Eyes:  Denies blurring, double vision, and vision loss-both eyes. ENT:  Denies difficulty swallowing and hoarseness. CV:  Complains of chest pain or discomfort; denies bluish discoloration of lips  or nails, difficulty breathing at night, difficulty breathing while lying down, leg cramps with exertion, lightheadness, near fainting, palpitations, shortness of breath with exertion, swelling of feet, and swelling of hands. Resp:  Denies cough, shortness of breath, sputum productive, and wheezing. GI:  Denies abdominal pain, bloody stools, dark tarry stools, and indigestion. Derm:  Denies poor wound healing. Neuro:  Denies numbness and tingling. Endo:  Denies excessive hunger, excessive thirst, and excessive urination.  Physical Exam  General:  in no acute distress; cooperative throughout examination but insight & judgement in question due to unaddressed exertional cp & continued smoking Ears:  decreased hearing; aid on R Neck:  No deformities, masses, or tenderness noted. Lungs:  Normal respiratory effort, chest expands symmetrically. Lungs are clear to auscultation, no crackles or wheezes. Heart:  Normal rate and regular rhythm. S1 and S2 normal without gallop,  click, rub .grade 1 /6 systolic murmur.   Abdomen:  Bowel sounds positive,abdomen soft and non-tender without masses, organomegaly or hernias noted. Pulses:  R and L carotid,radial  pulses are full and equal bilaterally. Decreased pedal pulses Extremities:  No  cyanosis. Marked deformities of toes & nails, esp L foot with marked op changes.1+ left pedal edema and 1+ right pedal edema.   Neurologic:  alert & oriented X3 and DTRs symmetrical  but decreased.  Intermittent rest  tremor RUE, not "pill rolling" Skin:  Mild Rosacea over forehead Cervical Nodes:  No lymphadenopathy noted Axillary Nodes:  No palpable lymphadenopathy Psych:  Some denial suggested;normally interactive and good eye contact.  Impression & Recommendations:  Problem # 1:  CHEST PAIN (ICD-786.50)  Orders: EKG w/ Interpretation (93000) Cardiology Referral (Cardiology) Tobacco use cessation intermediate 3-10 minutes (16109); risks discussed of 2-3X  increased risk of cardiac event or CVA Venipuncture (60454) T-2 View CXR (71020TC)  Problem # 2:  C A D (ICD-414.00)  His updated medication list for this problem includes:    Atenolol 25 Mg Tabs (Atenolol) .Marland Kitchen... Take one tablet daily    Benazepril Hcl 20 Mg Tabs (Benazepril hcl) .Marland Kitchen... Take one tablet by mouth daily  Orders: EKG w/ Interpretation (93000) Cardiology Referral (Cardiology) Tobacco use cessation intermediate 3-10 minutes (09811) Venipuncture (91478)  Problem # 3:  SMOKER (ICD-305.1)  Risk discussed  Orders: Tobacco use cessation intermediate 3-10 minutes (99406) Venipuncture (29562) T-2 View CXR (71020TC)  Problem # 4:  ROSACEA (ICD-695.3)  Orders: Prescription Created Electronically [phone]) Venipuncture (57846)  Problem # 5:  HYPOTHYROIDISM (ICD-244.9)  His updated medication list for this problem includes:    Synthroid 100 Mcg Tabs (Levothyroxine sodium) .Marland Kitchen... 1 by mouth once daily  Orders: Venipuncture (96295)  Problem # 6:  COLONIC POLYPS, HX OF (ICD-V12.72)  as per GI with clearance from Dr [name]  Complete Medication List: 1)  Glimepiride 2 Mg Tabs (Glimepiride) .Marland Kitchen.. 1 by mouth once daily patient needs to call and make appointment to see md 2)  Metformin Hcl 1000 Mg Tabs (Metformin hcl) .Marland Kitchen.. 1 by mouth bid 3)  Atenolol 25 Mg Tabs (Atenolol) .... Take one tablet daily 4)  Benazepril Hcl 20 Mg Tabs (Benazepril hcl) .... Take one tablet by mouth daily 5)  Synthroid 100 Mcg Tabs (Levothyroxine sodium) .Marland Kitchen.. 1 by mouth once daily 6)  Pravastatin Sodium 40 Mg Tabs (Pravastatin sodium) .Marland Kitchen.. 1 qhs 7)  Metrogel 1 % Gel (Metronidazole) .... Apply o rash once daily after cleansing; patient may apply 8)  Pancrease Mt 4 07-18-11 Mu Cpep (Amylase-lipase-protease) .Marland Kitchen.. 1 pill before breakfast to prevent diarrhea  Patient Instructions: 1)  Stop Smoking Tips: Choose a Quit date. Cut down before the Quit date. decide what you will do as a substitute when you  feel the urge to smoke(gum,toothpick,exercise). Prescriptions: METROGEL 1 % GEL (METRONIDAZOLE) apply o rash once daily after cleansing; patient may apply  #60 x 11   Entered and Authorized by:   Marga Melnick MD   Signed by:   Marga Melnick MD on 09/24/2009   Method used:   Faxed to ...       [facility] [address] (retail)       [phone]       [place], [place]  [address]       Ph: [phone]       Fax: [phone]   RxID:   [id number]

## 2010-09-15 NOTE — Miscellaneous (Signed)
Summary: Care Plan/[facility]   Imported By: Lanelle Bal 09/10/2009 11:17:30  _____________________________________________________________________  External Attachment:    Type:   Image     Comment:   External Document

## 2010-09-15 NOTE — Miscellaneous (Signed)
Summary: Diabetic Parameters/[facility]  Diabetic Parameters/[facility]   Imported By: Lanelle Bal 04/19/2010 11:50:28  _____________________________________________________________________  External Attachment:    Type:   Image     Comment:   External Document

## 2010-09-15 NOTE — Miscellaneous (Signed)
Summary: Physician Orders/[facility]  Physician Orders/[facility]   Imported By: Lanelle Bal 07/26/2010 12:21:32  _____________________________________________________________________  External Attachment:    Type:   Image     Comment:   External Document

## 2010-09-15 NOTE — Miscellaneous (Signed)
Summary: Physician Orders/[facility]  Physician Orders/[facility]   Imported By: Lanelle Bal 01/14/2010 09:56:54  _____________________________________________________________________  External Attachment:    Type:   Image     Comment:   External Document

## 2010-09-15 NOTE — Miscellaneous (Signed)
Summary: Physician Orders/[facility]  Physician Orders/[facility]   Imported By: Lanelle Bal 07/08/2010 14:45:47  _____________________________________________________________________  External Attachment:    Type:   Image     Comment:   External Document

## 2010-09-15 NOTE — Letter (Signed)
Summary: [facility]   Imported By: Lanelle Bal 10/29/2009 13:15:03  _____________________________________________________________________  External Attachment:    Type:   Image     Comment:   External Document

## 2010-10-22 ENCOUNTER — Encounter: Payer: Self-pay | Admitting: Internal Medicine

## 2010-10-24 LAB — URINE CULTURE: Culture  Setup Time: 201112280115

## 2010-10-24 LAB — COMPREHENSIVE METABOLIC PANEL
ALT: 24 U/L (ref 0–53)
Albumin: 4.1 g/dL (ref 3.5–5.2)
Alkaline Phosphatase: 74 U/L (ref 39–117)
Chloride: 106 mEq/L (ref 96–112)
GFR calc Af Amer: >60 mL/min (ref >60)
GFR calc non Af Amer: >60 mL/min (ref >60)
Sodium: 138 mEq/L (ref 135–145)

## 2010-10-24 LAB — CK TOTAL AND CKMB (NOT AT ARMC): Relative Index: 2.3 (ref 0.0–2.5)

## 2010-10-24 LAB — CBC
HCT: 40 % (ref 39.0–52.0)
Hemoglobin: 13.4 g/dL (ref 13.0–17.0)
MCHC: 33.5 g/dL (ref 30.0–36.0)
MCV: 87.9 fL (ref 78.0–100.0)
Platelets: 187 10*3/uL (ref 150–400)
RBC: 4.55 MIL/uL (ref 4.22–5.81)
RDW: 14.8 % (ref 11.5–15.5)

## 2010-10-24 LAB — DIFFERENTIAL
Basophils Relative: 1 % (ref 0–1)
Eosinophils Relative: 2 % (ref 0–5)
Monocytes Relative: 7 % (ref 3–12)
Neutro Abs: 3.3 10*3/uL (ref 1.7–7.7)

## 2010-10-24 LAB — URINALYSIS, ROUTINE W REFLEX MICROSCOPIC
Bilirubin Urine: NEGATIVE
Glucose, UA: NEGATIVE mg/dL
Ketones, ur: NEGATIVE mg/dL
pH: 5.5 (ref 5.0–8.0)

## 2010-12-05 ENCOUNTER — Encounter: Payer: Self-pay | Admitting: Internal Medicine

## 2010-12-05 ENCOUNTER — Ambulatory Visit (INDEPENDENT_AMBULATORY_CARE_PROVIDER_SITE_OTHER): Payer: PRIVATE HEALTH INSURANCE | Admitting: Internal Medicine

## 2010-12-05 DIAGNOSIS — R259 Unspecified abnormal involuntary movements: Secondary | ICD-10-CM

## 2010-12-05 DIAGNOSIS — IMO0001 Reserved for inherently not codable concepts without codable children: Secondary | ICD-10-CM

## 2010-12-05 DIAGNOSIS — F17200 Nicotine dependence, unspecified, uncomplicated: Secondary | ICD-10-CM

## 2010-12-05 DIAGNOSIS — R202 Paresthesia of skin: Secondary | ICD-10-CM

## 2010-12-05 DIAGNOSIS — R209 Unspecified disturbances of skin sensation: Secondary | ICD-10-CM

## 2010-12-05 NOTE — Patient Instructions (Signed)
Diabetes Monitor   The A1c test checks the average amount of glucose (sugar) in the blood over the last 2 to 3 months.As glucose circulates in the blood, some of it binds to hemoglobin A. This is the main form of hemoglobin in adults. Hemoglobin is a red protein that carries oxygen in the red blood cells (RBC's). Once the glucose is bound to the hemoglobin A, it remains there for the life of the red blood cell (about 120 days). This combination of glucose and hemoglobin A is called A1c (or hemoglobin A1c or glycohemoglobin). Increased glucose in the blood, increases the hemoglobin A1c. A1c levels do not change quickly but will shift as older RBC's die and younger ones take their place.   The A1c test is used primarily to monitor the glucose control of diabetics over time. The goal of those with diabetes is to keep their blood glucose levels as close to normal as possible. This helps to minimize the complications caused by chronically elevated glucose levels, such as progressive damage to body organs like the kidneys, eyes, cardiovascular system, and nerves. The A1c test gives a picture of the average amount of glucose in the blood over the last few months. It can help a patient and his doctor know if the measures they are taking to control the patient's diabetes are successful or need to be adjusted.     Depending on the type of diabetes that you have, how well your diabetes is controlled, your A1c may be measured 2 to 4 times each year. When someone is first diagnosed with diabetes or if control is not good, A1c may be ordered more frequently.   NORMAL VALUES  Non diabetic adults: 5 %-6.1%  Good diabetic control: 6.2-6.4 %  Fair diabetic control: 6.5-7%  Poor diabetic control: greater than 7 % ( except with additional factors such as  advanced age; significant coronary or neurologic disease,etc). Check the A1c every 6 months if it is < 6.5%; every 4 months if  6.5% or higher. Goals for home glucose  monitoring are : fasting  or morning glucose goal of  90-150. Two hours after any meal , goal = < 180, preferably < 150.       

## 2010-12-05 NOTE — Progress Notes (Signed)
Subjective:    Patient ID: James [name], male    DOB: 07-22-1935, 76 y.o.   MRN: [id number]  HPI  Diabetes status assessment :Fasting or morning glucose range:  108- 140 or average :  Not calculated    ;   Highest glucose 2 hours after any meal:  Not checked  ;  Hypoglycemia :  no  .                                                           Excess thirst :no;  Excess hunger:  no ;  Excess urination:  no .                                            Lightheadedness with standing:  no ; Chest pain:  no ; Palpitations :no ;  Pain in  calves with walking:  No, but in heels .                                                                                                                                                              Non healing skin  ulcers or sores,especially over the feet:  no ; Numbness or tingling or burning in feet : no.                                                                                                                                                 Significant change in  Weight : up 1 #  ;Vision changes : no  Exercise : minimal ;  Nutrition/diet:  no;   Medication compliance : good ; Medication adverse  Effects:  no  ;  Eye exam : fall 2011 ; Foot care : Dr Jaquita Folds notes 10/28/2010 reviewd ;  A1c/ urine microalbumin monitor:  Serial flowsheet reviewed . Last A1c was 12/2009: 7.9%( average sugar 180; long term risk 58% increased)    Review of Systems New issue : intermittent  Numbness LUE X 2-3 weeks. Severity: minor Worse with:no trigger or exacerbating factor ; not positional or  related to sleep  Better with: no reliever  Impaired range of motion: no History of trauma:  no   Past history of similar problem:  no  Back/ Neck  Pain:  no  Weakness:  yes, ? Slightly in LUE Red Flags Fever:  no  Headache:  no  Bowel/bladder dysfunction:  no  His podiatrist has diagnosed plantar fasciitis. He had been  using meloxicam; she  encouraged him to use this as infrequently as possible. I reiterated long-term potential cardiac or GI risk with this medicine. She also Rxed arch supports.       Objective:   Physical Exam  Gen.: well-nourished in appearance. Alert, appropriate and cooperative throughout exam. Head: Normocephalic without obvious abnormalities;   pattern alopecia   Hearing is grossly abnormal despite aid worn on R. Neck: No deformities, masses, or tenderness noted. Range of motion  normal. Thyroid  : no nodules. Lungs: Normal respiratory effort; chest expands symmetrically. Lungs are clear to auscultation without rales, wheezes, or increased work of breathing.BS slightly decreased Heart: Normal rate and rhythm. Normal S1 and S2. No gallop, click, or rub. S4 with slurring; no murmur.  Musculoskeletal/extremities:  No clubbing, cyanosis. Trace pedal edema. Congenital & surgical  toe deformities noted. Tone & strength  normal.Fingernail health  Good; but chronic toenail changes. Vascular: Carotid, radial artery pulses are full and equal. Pedal pulses decreased Neurologic: Alert and oriented x3. Deep tendon reflexes symmetrical and 1/2+.Decreased sensation to light touch over dorsal feet . Light touch = over forearms.Intermittent coarse tremor RUE.         Skin: Intact with mild facial Rosacea over forehead Lymph: No cervical, axillary, or inguinal lymphadenopathy present. Psych: Mood and affect are normal. Normally interactive                                                                                       Assessment & Plan:  #1 diabetes; questionable poor control  #2 paresthesias, left upper extremity by history, not documented on exam  #3 foot deformities, as per the podiatrist  #4 hypertension  Plan: #1 A1c.  #2 TSH, B12, VDRL  #3 BUN, creatinine, potassium

## 2010-12-06 LAB — CREATININE, SERUM: Creatinine, Ser: 1 mg/dL (ref 0.4–1.5)

## 2010-12-06 LAB — VITAMIN B12: Vitamin B-12: 117 pg/mL — ABNORMAL LOW (ref 211–911)

## 2010-12-06 LAB — HEMOGLOBIN A1C: Hgb A1c MFr Bld: 7.3 % — ABNORMAL HIGH (ref 4.6–6.5)

## 2010-12-06 LAB — MICROALBUMIN / CREATININE URINE RATIO
Creatinine,U: 141.4 mg/dL
Microalb, Ur: 1.2 mg/dL (ref 0.0–1.9)

## 2010-12-27 ENCOUNTER — Ambulatory Visit (INDEPENDENT_AMBULATORY_CARE_PROVIDER_SITE_OTHER): Payer: Medicare Other | Admitting: *Deleted

## 2010-12-27 DIAGNOSIS — E538 Deficiency of other specified B group vitamins: Secondary | ICD-10-CM

## 2010-12-27 MED ORDER — CYANOCOBALAMIN 1000 MCG/ML IJ SOLN
1000.0000 ug | Freq: Once | INTRAMUSCULAR | Status: AC
  Administered 2010-12-27: 1000 ug via INTRAMUSCULAR

## 2010-12-27 NOTE — H&P (Signed)
NAME:  [name], [name] NO.:  [id number]   MEDICAL RECORD NO.:  [id number]          PATIENT TYPE:  INP   LOCATION:  1402                         FACILITY:  [facility]   PHYSICIAN:  Anselm Pancoast. Weatherly, M.D.DATE OF BIRTH:  March 23, 1935   DATE OF ADMISSION:  12/06/2007  DATE OF DISCHARGE:                              HISTORY & PHYSICAL   CHIEF COMPLAINT:  Possibly gallbladder problem.   HISTORY:  James [name] is a 76 year old diabetic who was admitted on  December 06, 2007 following an episode of hypoglycemia and confusion. EMS  was called and noted to have low blood sugar and some improvement in his  mental status after they gave him D5. He has a history of coronary  artery bypass, tobacco abuse and diabetes and I think lives alone.  He  is on oral medication for his diabetes and when he was admitted was  described as normal vital signs with O2 sat of 95%, awake, but  confused, but he did follow some commands.  His abdomen as described was  nontender, nondistended with positive bowel sounds and no  hepatosplenomegaly. His Schuneman count on admission was 14,700 with 85%  neutrophils and his confusion has improved and then a history of  possibly abdominal pain was detected by someone and an ultrasound of the  gallbladder was done which showed a thickened gallbladder and this  possibly could have been a gallbladder problem.  The patient really  denies abdominal pain and on examination he has not really acutely  tender in the right upper quadrant.  His Woolever count which was 14, came  down and then has now become much higher, but I really not sure that we  could count all the problems on his gallbladder.  I think that if his  gallbladder was given now a Sime count of 26,000, he would certainly be  more tender.  He is confused, but not that confused in my opinion.  I  think that what will need to be done as that this hospitalization he was  made of laparoscopic cholecystectomy,  cholangiogram and does not appear  that he has got any common duct stones and I do not think it is  necessary they have emergency cholecystectomy today.  We did do a CT and  CT shows only one other findings that does have a thickened gallbladder  as if possibly he has subacute cholecystitis with stones.  There is some  kind of edema around the bladder.  He has a Foley catheter placed and  the patient says he think he has had a problem with bladder infections  prior to this. There is nothing that really looks like diverticulitis on  the CT of his sigmoid colon.  He does have a marked valgus deformity of  his left great toe with a little bit of an open wound but does not look  like he has a hot swollen foot that would give a 26,000 Hollomon count.  I  have placed the patient n.p.o. after clear liquid breakfast tomorrow and  Dr. Felicity Coyer is in agreement we will added  to the OR schedule for a  cholecystectomy tomorrow afternoon on Wednesday with general anesthesia.           ______________________________  Anselm Pancoast. Zachery Dakins, M.D.     WJW/MEDQ  D:  12/10/2007  T:  12/10/2007  Job:  [id number]

## 2010-12-27 NOTE — H&P (Signed)
James [name], James [name] NO.:  [id number]   MEDICAL RECORD NO.:  [id number]          PATIENT TYPE:  INP   LOCATION:  1402                         FACILITY:  [facility]   PHYSICIAN:  Gardiner Barefoot, MD    DATE OF BIRTH:  [birth year]   DATE OF ADMISSION:  12/06/2007  DATE OF DISCHARGE:                              HISTORY & PHYSICAL   PRIMARY CARE PHYSICIAN:  Dr. Alwyn Ren of [place].   CHIEF COMPLAINT:  Altered mental status.   HISTORY OF PRESENT ILLNESS:  This is a 76 year old male with diabetes,  CAD, status post CABG, who was found down, confused, by his family after  not being able to get in touch with him for about 2 days.  The patient  was stumbling around and not making sense.  EMS was called and noted to  have a low blood sugar at the time, which responded to D50, and EMS  reports that it did have some improvement in his mental status after  that.  No known report of any fever or illnesses recently.   PAST MEDICAL HISTORY:  1. CAD.  2. Status post CABG.  3. Hyperlipidemia.  4. Tobacco abuse.  5. Diabetes.  6. History of ankle fracture in 999.   MEDICATIONS:  Include:  1. Atenolol 25 mg daily.  2. Benazepril 20 mg daily.  3. Glimepiride 2 mg daily.  4. Metformin 1000 mg b.i.d.   ALLERGIES:  NO KNOWN DRUG ALLERGIES.   SOCIAL HISTORY:  The patient continues to smoke and no known history of  alcohol or drugs.   FAMILY HISTORY:  Is unobtainable at this time.   REVIEW OF SYSTEMS:  Unobtainable at this time, secondary to confusion.   PHYSICAL EXAM:  VITALS:  Temperature 97.6, pulse 93, respirations 22,  blood pressure 137/57, O2 sats 95%.  GENERAL:  The patient is awake and alert, confused, though he does  follow simple commands.  CARDIOVASCULAR:  Regular rate and rhythm with no murmurs, rubs or  gallops.  LUNGS:  Clear to auscultation bilaterally.  ABDOMEN:  Soft, nontender, nondistended with positive bowel sounds and  no hepatosplenomegaly.  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  SKIN:  Exam with notable abrasions on his left knee and right leg.  NEURO:  Nonfocal, follows commands, however, is not oriented and does  appear confused.   LABORATORY DATA:  With a CK total of 862 with a CK-MB of 36.7.  UA is  negative, WBC is 14.7 with 85% neutrophils, hemoglobin 14, platelets  242, sodium 142, potassium 4.8, chloride 107, bicarb 24, BUN 40,  creatinine is 1.06 and glucose 83.   Chest x-ray with borderline cardiomegaly and CT scan of the head with  moderate small-vessel disease.  However, no acute disease.   ASSESSMENT/PLAN:  1. Altered mental status.  This may be secondary to hypoglycemic      episode, as his confusion did respond to D50.  Will start the      patient on D5 half normal saline to get him  more hydrated and hold      any sedating medications and follow him closely.  I will also have      his physical therapy and occupational therapy see him.  2. Hypertension.  Will hold his benazepril this time, with an      increased BUN and somewhat increased creatinine, but will continue      with his atenolol for his blood pressure.  3. Leukocytosis.  The patient is not febrile, he does feel warm to me,      no obvious sites of infection.  I will follow his WBC, this may be      reactive in nature.  4. Diabetes.  Will hold his anti-hyperglycemic at this time and      continue with D5, to assure that his blood sugars remained stable.      Gardiner Barefoot, MD  Electronically Signed     RWC/MEDQ  D:  12/06/2007  T:  12/07/2007  Job:  [id number]

## 2010-12-27 NOTE — Op Note (Signed)
NAME:  James [name], James [name] NO.:  [id number]   MEDICAL RECORD NO.:  [id number]          PATIENT TYPE:  INP   LOCATION:  1402                         FACILITY:  [facility]   PHYSICIAN:  James [name], M.D.DATE OF BIRTH:  27-Jun-1935   DATE OF PROCEDURE:  12/11/2007  DATE OF DISCHARGE:                               OPERATIVE REPORT   PREOPERATIVE DIAGNOSIS:  Subacute cholecystitis with stones, diabetes  mellitus, history of hypoglycemic episode.   POSTOPERATIVE DIAGNOSIS:  Acute cholecystitis with many, many stones.   OPERATION PERFORMED:  Laparoscopic cholecystectomy with __________.   SURGEON:  James [name], M.D.   ASSISTANT:  James [name], M.D.   ANESTHESIA:  General.   INDICATIONS FOR PROCEDURE:  Render James [name] is a 76 year old male who is  sort of in early senility, but lives alone and was admitted after an  episode where he was extremely hypotensive and very confused  approximately three days ago.  He reportedly had a glucose of about 50  and was admitted to Dr. Diamantina [name] service on April 24 and he is a  patient of James [name]. James Ren, MD.  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  He  has had coronary artery bypass surgery and still smokes and on  examination on admission, he was described as not having any abdominal  pain.  Mental status is at best markedly confused and his cardiac status  on admission was negative.  His Dai blood cell count was 14,700 and a  BUN of 40. He was hydrated, in no acute distress control of his glucose  and then history was kind of obtained from some of his relatives and  they see he had possibly complained of abdominal pain previously.  His  alkaline phosphatase and his liver tests were slightly abnormal, but  they improved and then an ultrasound of the gallbladder was performed  and it showed gallstones.  There was thickening of the ultrasound, but  whether he had acute cholecystitis is not sure.  His Degroat count which  initially was 14 went down to 10 but then jumped up to about 26,000.  I  was asked to see the patient yesterday and surprised that he really was  not tender in the upper abdomen.  His bowels were kind of irregular, but  worked very well and I agree with James [name] that I would get a CT to  make sure we do not see any other abnormalities because of his confused  status and this was done with a very impressive thickened gallbladder  just packed with stones was noted.  I thought it would be best to add  him to the operating room schedule today since he was not febrile.  He  was on broad antibiotics originally.  They had him on Zosyn and  vancomycin  with a James [name] blood count elevated.  Today, permission was  obtained from a family member as well as the patient.  James [name] was  in agreement with proceeding with surgery.  He was added to the  operating room schedule and no additional antibiotics were given as he  had had both the vanco and Zosyn and penicillin also within hour and he  will be added on OR time.   DESCRIPTION OF PROCEDURE:  The patient was taken back to the operative  suite.  Induction of general anesthesia with the endotracheal tube, oral  tube inserted to the stomach and then the abdomen was clipped around the  port sites area.  He was draped in sterile manner and a small incision  was made below the umbilicus and we very carefully entered into the  peritoneal cavity.  The colon is distended and the gallbladder is  markedly inflamed.  The upper 10 mm trocar was placed under direct  vision and then two lateral 5 mm trocars were placed by  James [name].  We  then tried to free up the colon where it was adherent to the liver to  give [place] a little better exposure and we took the giant Norsen  aspirator  and decompressed the gallbladder as  best as possible, it was so filled  with stones that it was not possible to truly collapse the gallbladder.  Then we switched to a 30 degree camera with the switch blade with the  head  up and to the left side, still no good exposure.  I put an extra 5  mm port so I could take a Glassman ____________  press on the colon to  give [place] a chance of actually seeing the gallbladder.  We then sort of  dropped the adhesions down on the medial aspect which gave [place] a better  view.  We then very carefully opened up the peritoneal reflection  inferiorly and could see the artery which we triply clipped and then  divided it to see what we think is the cystic duct.  This was triply  clipped and divided it.  Because of the inflammation, it looks like he  has had some spillage of bile, but probably got a leak because his James [name]  count went up so high and the cystic duct is not all that prominent in  size and exposure is difficult and there was some bile spillage from the  introitus of the gallbladder and ___________  aspirate __________  gallbladder. The cystic duct was then divided and then kind of bluntly  and sharply dissected the peritoneal reflection and we could kind of  start teasing the gallbladder through the port.  There was just marked  inflammation.  The gallbladder was right in the liver and we dissected  with the spatula electrocautery to tease the gallbladder from its bed.  There was a little venous leak about midway that was cauterized heavily  ______________  and then with particular care we inserted the  gallbladder in the bag.  It would barely hold this distended  gallbladder.  We then irrigated, aspirated, put some Surgicel  ____________  and then switched _____________ through the operative port  to grab the string attached to the bag and then pulled it up and then  very carefully opened up the bag so we could get the gallbladder in it  and  started removing stones, we estimate it was  probably 600 or 700  stones about 3 to 4 mm in size.  I have never seen so many stones.  We  removed the stones and then collapsed the  gallbladder and then could  bring the gallbladder and bag out intact.  Next, we reinserted a forceps  and then began to aspirate off with the Hasson cannula and encountered a  little bleeding.  Surgicel was removed and we took it out.  We  cauterized ________________  .  We then replaced the  Surgicel______________  port and sutured it to the skin and then  reinspected the liver bed for hemostasis and then removed first the 5 mm  port to the left of midline that had been placed to press down on the  colon, then the other 5 mm port, removed the port at the umbilicus,  sutured it with an additional figure-of-eight suture of 0 Vicryl in  addition to the pursestring sutures .  Next, the upper 10 mm port was  withdrawn under direct vision.  At completion I then put a simple suture  _____________.  The  subcutaneous wounds were closed with benzoin and  Steri-Strips _____________.  The  patient tolerated the procedure satisfactorily.  We will send the  patient to the recovery room  and then to the step down and can  hopefully the floor.  We will watch the drainage with the [facility], repeat  liver tests in approximately 24 hours and then plan on leaving the [place]  drain in at least three or four days.           ______________________________  James [name], M.D.     WJW/MEDQ  D:  12/11/2007  T:  12/11/2007  Job:  [id number]   cc:   James [name]. James Ren, MD,FACP,FCCP  [phone] [place]  [place] [address]   Vikki Ports A. Felicity Coyer, MD  [address] [place], [place] [address]   James [name], M.D.  [address]  [place]  [place] [address]

## 2010-12-27 NOTE — Discharge Summary (Signed)
NAME:  James [name], James [name]                ACCOUNT NO.:  [id number]   MEDICAL RECORD NO.:  [id number]          PATIENT TYPE:  INP   LOCATION:  1535                         FACILITY:  [facility]   PHYSICIAN:  James [name], MDDATE OF BIRTH:  [birth year]   DATE OF ADMISSION:  12/06/2007  DATE OF DISCHARGE:  12/16/2007                               DISCHARGE SUMMARY   DISCHARGE DIAGNOSES:  1. Acute cholecystitis status post laparoscopic cholecystectomy December 11, 2007.  2. Acute blood loss anemia secondary to problem #1.  3. Altered mental status.  4. Type 2 diabetes.  5. Hypothyroidism, newly diagnosed this admission.   HISTORY OF PRESENT ILLNESS:  James [name] is a 76 year old James [name] male with  history of type 2 diabetes and coronary artery disease, who presented to  the emergency room on the day of admission after being found on the  floor and very confused by the family.  Family checked on the patient  due to inability to get in touch with him after two days.  Upon EMS's  arrival to the home, the patient found to be hypoglycemic and responded  well to IV D50 with some improvement in his mental status.  However, the  patient did remain confused.  Upon evaluation in the ER, the patient's  sister-in-law did mention patient complaining of abdominal pain on week  prior to admission with decreased p.o. intake.  Lab work done in the  emergency revealed Palmateer cell count 14.7 with 85% neutrophilic shift,  with a total CK of 862 and CK-MB of 36.7.  Lab work was otherwise  unremarkable.  Due to the patient's altered mental status and difficulty  obtaining history, the patient was admitted for further evaluation and  treatment.   PAST MEDICAL HISTORY:  1. CAD status post CABG 2002.  2. Hyperlipidemia.  3. History of tobacco abuse.  4. Type 2 diabetes.  5. Ankle fracture in 1999.   HOSPITAL COURSE:  1. Altered mental status.  The patient's symptoms thought likely      secondary to hypoglycemic  and as confusion did improve with D50.      However, up upon further discussion with the patient's sister-in-      law, the patient said to not suffer from any confusion at his      baseline.  She states he is actually quite sharp with great recall,      however, she did he has always different. Further lab workup      revealed increased TSH.  In addition, a CMET was obtained which      revealed elevated liver function tests despite patient denying any      abdominal pain, nausea or vomiting.  As mentioned in the HPI,      sister-in-law did mention that the patient reported abdominal pain      on week prior to admission, therefore ultrasound of the abdomen      obtained revealing cholelithiasis with probable gallbladder wall      thickening, not able to exclude acute cholecystitis.  Due to the  patient's continued increased James [name] cell count despite antibiotic      treatment and increasing LFTs, general surgery was asked to      consult.  The patient did undergo laparoscopic cholecystectomy on      December 11, 2007, by Dr. Zachery [name].  The patient tolerated procedure      without any difficulties.  He did suffer from mild acute blood loss      anemia but did not require any transfusions throughout the      hospitalization.  The patient's liver function tests have decreased      significantly status post cholecystectomy.  The patient now      tolerating regular diet without any difficulties.  He denies any      nausea, vomiting or abdominal pain.  2. Type 2 diabetes.  As mentioned before, patient with hypoglycemic      event on admission.  Question whether or not the patient is eating      well due to problem #1 and continuation of oral agent.  The      patient's blood sugar has remained stable throughout this      hospitalization.  He has been maintained on sliding scale insulin      coverage.  It is felt safe at this time to resume the patient's      oral agent with no insulin coverage  as he is eating well at this      time.  Hemoglobin A1c obtained during this admission 6.7.  3. Hypothyroidism.  TSH obtained in setting of altered mental status.      TSH results of 15.650 on December 07, 2007, with free T4 of 0.52 and      T3 of 1.6.  The patient started on low-dose p.o. Synthroid.  He      will need approximately four-week follow-up with a repeat TSH.   DISCHARGE MEDICATIONS:  1. Atenolol 25 mg p.o. daily.  2. Amaryl 2 mg p.o. daily.  3. Metformin 500 mg p.o. b.i.d.  4. Benazepril 20 mg p.o. daily.  5. Tylenol 650 mg p.o. q.4h. p.r.n.  6. Hydrocodone/APAP 5/325 mg p.o. q.4h. p.r.n.  7. Laxative of choice p.r.n.  8. Synthroid 25 mg p.o. daily.   PERTINENT DISCHARGE LABORATORY DATA:  Haws cell count 11.3, platelet  count 335, hemoglobin 10.3, hematocrit 30.5.  Total bili 1.8, alkaline  phosphatase 153, AST 27, ALT 38, total albumin 1.8.  Again, TSH 15.650,  T4 0.52, T3 1.6.  A1c 6.7.   PHYSICAL EXAMINATION AT DISCHARGE:  VITAL SIGNS:  Blood pressure 162/89,  heart rate 74, respirations 20, temperature 97, O2 saturation 94% on  room air.  GENERAL:  [name], well-nourished, well-developed  in no acute distress.  HEENT: Pupils are equal, round, reactive to light.  Mucous membranes are  moist.  No scleral icterus or injection.  NECK:  Supple without any thyromegaly or lymphadenopathy.  No JVD or  carotid bruits.  CHEST:  Decreased inspiratory effort.  Lung sounds clear to auscultation  bilaterally.  No wheezes, rales or crackles.  HEART:  Heart sounds S1-S2, regular rate and rhythm.  ABDOMEN:  Obese, soft, nontender, nondistended with positive bowel  sounds.  EXTREMITIES:  Without any clubbing, cyanosis or edema bilaterally to  lower extremities.  NEUROLOGIC:  The patient is alert and appropriate.  The patient without  any motor or sensory deficits on exam.   DISPOSITION:  The patient felt medically stable for discharge to  skilled  nursing  facility at this time.  Upon discussion with sister-in-law  during this hospitalization, the patient felt not safe to go home as  living situation  condemnable.  The patient is agreeable to skilled nursing facility  placement.  As recommended by PT and OT, the patient to continue PT and  OT at nursing home facility.  At time of discharge, the patient  requiring maximum assist with transfers.  The patient to follow up with  his primary care physician, Dr. Marga Melnick, as needed.      Cordelia Pen, NP      Raenette Rover. Felicity Coyer, MD  Electronically Signed    LE/MEDQ  D:  12/16/2007  T:  12/16/2007  Job:  [id number]   cc:   Titus Dubin. Alwyn Ren, MD,FACP,FCCP  [phone] [place]  [place] [address]

## 2010-12-30 NOTE — H&P (Signed)
NAME:  James [name], James [name] NO.:  [id number]   MEDICAL RECORD NO.:  [id number]          PATIENT TYPE:  OIB   LOCATION:  2899                         FACILITY:  [facility]   PHYSICIAN:  Salvadore Farber, M.D. LHCDATE OF BIRTH:  1934-09-09   DATE OF ADMISSION:  09/01/2005  DATE OF DISCHARGE:                                HISTORY & PHYSICAL   PRIMARY CARE PHYSICIAN:  Dr. Marga Melnick   PRIMARY CARDIOLOGIST:  Dr. Rollene Rotunda   PATIENT PROFILE:  A 76 year old [name] male with prior history of CAD who  presents for PCI and stenting of the vein graft to the obtuse marginal.   PROBLEM LIST:  1.  Coronary artery disease.      1.  Status post CABG x6 performed in [place], [place] with a          left internal mammary artery to the left anterior descending, vein          graft to the diagonal, sequential vein graft to the obtuse marginal          1 and 2, sequential vein graft to the posterior descending artery          and posterolateral.      2.  August 29, 2005 cardiac catheterization revealing native severe          three vessel disease with patent left internal mammary artery to the          left anterior descending, 50% stenosis in the vein graft to the          first diagonal, a totally occluded sequential vein graft to the          posterior descending artery and posterolateral, and a 99% stenosis          in the vein graft to the obtuse marginal.  2.  Hyperlipidemia.  3.  Ongoing tobacco use.  4.  Type 2 diabetes mellitus.  5.  History of cataract surgery.  6.  Status post right ankle surgery following fracture in July of 1999.   HISTORY OF PRESENT ILLNESS:  A 76 year old male who recently saw Dr.  [name] in clinic on August 21, 2005 in follow-up to chest and epigastric  discomfort with subsequent low risk perfusion study and EF 64%.  There was  very slight anterior ischemia noted on that perfusion study.  He had been  continuing to experience  exertional chest discomfort and therefore underwent  cardiac catheterization on January 16 which revealed severe native three  vessel disease as well as totally occluded vein graft to the PDA/PL and a  99% lesion at the anastomosis of the vein graft to the OM.  Films were  reviewed with Dr. [name] and decision was made to pursue PCI which will take  place today.   ALLERGIES:  None.   CURRENT MEDICATIONS:  1.  Aspirin 325 mg daily.  2.  Atenolol 25 mg daily.  3.  Amaryl 2 mg daily.  4.  Lipitor 40 mg daily.  5.  Metformin 1000 mg daily.  6.  Plavix 75  mg daily.   FAMILY HISTORY:  Noncontributory for early coronary disease.   SOCIAL HISTORY:  Patient has never been married and has no children.  He has  been smoking for 35 years.   REVIEW OF SYSTEMS:  Positive for chest and epigastric discomfort.  Positive  for history of diabetes.  Positive for anxiety.  All other systems reviewed  and negative.   PHYSICAL EXAMINATION:  VITAL SIGNS:  Temperature 97.3, heart rate 63,  respirations 16, blood pressure 134/65, pulse ox 93% on room air.  He is 5  feet 10 inches and weight is 202 pounds.  GENERAL:  Pleasant [place] male in no acute distress.  Awake, alert and  oriented x3.  NECK:  Normal carotid upstrokes.  No bruits, JVD.  LUNGS:  Respirations regular, unlabored.  Clear to auscultation.  CARDIAC:  Regular S1, S2.  No S3, S4, murmurs.  ABDOMEN:  Obese, soft, nontender, nondistended.  Bowel sounds present x4.  EXTREMITIES:  Warm, dry, pink.  No clubbing, cyanosis, edema.  Dorsalis  pedis, posterior tibial pulses 2+, equal bilaterally.   ACCESSORY CLINICAL FINDINGS:  None.   ASSESSMENT/PLAN:  1.  Coronary artery disease.  Patient represents today for PCI of the vein      graft to the obtuse marginal.  He remains on his current clinical      regimen which includes aspirin, beta blocker, Statin, and Plavix      therapy.  2.  Type 2 diabetes mellitus.  Metformin is currently on hold.   Add sliding      scale insulin.  3.  Hyperlipidemia.  Continue Lipitor.  4.  Tobacco abuse.  Smoking cessation strongly advised.      Ok Anis, NP      Salvadore Farber, M.D. [facility]  Electronically Signed    CRB/MEDQ  D:  09/01/2005  T:  09/01/2005  Job:  [id number]

## 2010-12-30 NOTE — Discharge Summary (Signed)
James [name], James [name] NO.:  [id number]   MEDICAL RECORD NO.:  [id number]          PATIENT TYPE:  OIB   LOCATION:  6526                         FACILITY:  [facility]   PHYSICIAN:  Learta Codding, M.D. LHCDATE OF BIRTH:  02/20/1935   DATE OF ADMISSION:  09/01/2005  DATE OF DISCHARGE:  09/02/2005                                 DISCHARGE SUMMARY   PRIMARY CARE PHYSICIAN:  Titus Dubin. Alwyn Ren, MD, [facility].   PRIMARY CARDIOLOGIST:  Rollene Rotunda, MD.   PRINCIPAL DIAGNOSIS:  Coronary artery disease.   OTHER DIAGNOSES:  1.  Hyperlipidemia.  2.  Ongoing tobacco abuse.  3.  Type 2 diabetes mellitus.  4.  History of cataract surgery.  5.  Status post right ankle surgery following fracture in July of 1999.   ALLERGIES:  No known drug allergies.   PROCEDURES:  PCI and cutting balloon angioplasty to the vein graft to the  obtuse marginal.   HISTORY OF PRESENT ILLNESS:  A 76 year old male with a prior history of CAD  status post CABG x6 performed in [place], [place], in 1992, who recently  saw Dr. [name] in clinic on August 21, 2005, in followup for chest pain  and epigastric discomfort with subsequent low risk perfusion study revealing  very slight anterior ischemia and an EF of 64%.  He had continued to  experience exertional chest discomfort and therefore underwent cardiac  catheterization on January 16th revealing severe native three-vessel disease  as well as a totally occluded vein graft to the PDA/PL, a 99% lesion of an  anastomosis to the vein graft to the obtuse marginal.  Films were reviewed  with Dr. [name] and decision was made to pursue PCI.   HOSPITAL COURSE:  The patient presented to the cardiac cath lab on January  19th and underwent successful PCI and cutting balloon angioplasty of the  vein graft to the obtuse marginal.  He tolerated this procedure well and has  been ambulating post procedure without any recurrent discomfort or  limitations.  As a post  procedure, CK-MB has remained negative at 1.9 and  ECG is without acute changes, he is being discharged home today in  satisfactory condition.   DISCHARGE LABORATORY DATA:  Hemoglobin 12.6, hematocrit 36.3, WBC 5.9,  platelets 203.  Sodium 142, potassium 4.0, chloride __________, CO2 23, BUN  9, creatinine 1.9, glucose 82.  CK 73, MB 1.9, troponin-I 0.6.  Calcium 8.6.   DISPOSITION:  The patient is being discharged home today in good condition.   FOLLOWUP PLANS AND APPOINTMENTS:  He is asked to see Dr. Alwyn Ren in three to  four weeks and will have a followup appointment with Dr. Jenene Slicker nurse  practitioner/PA in approximately two weeks.   DISCHARGE MEDICATIONS:  1.  Aspirin 325 mg daily.  2.  Plavix 75 mg daily.  3.  Atenolol 25 mg b.i.d.  4.  Lipitor 40 mg daily.  5.  Glimepiride 2 mg daily.  6.  Metformin 500 mg b.i.d. to be resumed September 03, 2005.  7.  Nitroglycerin 0.4 mg sublingual p.r.n. chest pain.  8.  Wellbutrin-SR 150 mg daily x3 days, then b.i.d. x3 months.  9.  Nicotine patch as directed.   OUTSTANDING LABORATORY STUDIES:  None.   DURATION OF DISCHARGE ENCOUNTER:  40 minutes including physician time.      Ok Anis, NP      Learta Codding, M.D. [facility]  Electronically Signed    CRB/MEDQ  D:  09/02/2005  T:  09/03/2005  Job:  [id number]   cc:   Titus Dubin. Alwyn Ren, M.D. [facility]  [phone] [place]  [place] [address]   Rollene Rotunda, M.D.  [address]  [place]  [place] [address]

## 2010-12-30 NOTE — Cardiovascular Report (Signed)
NAME:  LENNY, FIUMARA NO.:  [id number]   MEDICAL RECORD NO.:  [id number]          PATIENT TYPE:  OIB   LOCATION:  1961                         FACILITY:  [facility]   PHYSICIAN:  Rollene Rotunda, M.D.   DATE OF BIRTH:  [birth year]   DATE OF PROCEDURE:  08/29/2005  DATE OF DISCHARGE:                              CARDIAC CATHETERIZATION   PROCEDURE:  Left heart catheterization/coronary arteriography.   INDICATIONS FOR PROCEDURE:  The patient with chest pain status post CABG in  1992.  Did have a Cardiolite which suggested possible mild anterior  ischemia.   DESCRIPTION OF PROCEDURE:  Left heart catheterization was performed via the  right femoral artery.  The artery was cannulated using anterior wall  puncture.  A 4-French arterial sheath was inserted via modified Seldinger  technique.  Preformed Judkins and a pigtail catheter were utilized.  The  patient tolerated the procedure well and left the lab in stable condition.   RESULTS:  1.  Hemodynamics:  LV 132/19, AO1 36/90.  2.  Coronaries:  Left main was occluded proximally.  The LAD was occluded      proximally and seemed to fill via LIMA.  The LAD was free of high-grade      disease and wrapped the apex.  A large diagonal was occluded at the      osteum and seemed to fill via vein graft.  The circumflex was occluded      proximally at the left main.  A large branching mid obtuse marginal was      noted to fill via a vein graft.  The inferior branch (which looked to      run in or near the AV groove) had a proximal 95% stenosis.  The right      coronary artery had a long proximal 60% stenosis.  Within this there was      a mid 99% stenosis followed by total occlusion after the RV branch.  The      large PDA posterolateral filled via the collaterals from the LAD.      Perhaps a LIMA to the LAD was widely patent.  A saphenous vein graft to      the right coronary artery was occluded at the osteum.  Saphenous vein  graft to the obtuse marginal had diffuse luminal irregularities.  There      was a 99% anastomotic lesion.  This graft was attached to the superior      branch of the OM-1 and back filled the inferior branch across the 95%      blockage.  The saphenous vein graft to the diagonal was proximal with a      hazy area that was probably about 50% obstructed in the proximal      segment.  3.  Left ventriculogram:  The left ventriculogram was obtained in the RAO      projection.  The EF was 65% with no significant wall motion      abnormalities.  This was compromised somewhat by ventricular ectopy.   CONSULTATIONS:  Severe three-vessel coronary artery disease.  Vein graft  disease as described.  Well preserved ejection fraction.   PLAN:  I will review the films with Dr. [name] for  consideration of PCI of  the saphenous vein graft to the obtuse marginal.           ______________________________  Rollene Rotunda, M.D.     JH/MEDQ  D:  08/29/2005  T:  08/29/2005  Job:  [id number]   cc:   Titus Dubin. Alwyn Ren, M.D. [facility]  [phone] [place]  [place] [address]

## 2010-12-30 NOTE — Assessment & Plan Note (Signed)
[facility]                              CARDIOLOGY OFFICE NOTE   NAME:James [name], James [name]                         MRN:          [id number]  DATE:07/02/2006                            DOB:          [birth year]    PRIMARY CARE PHYSICIAN:  Titus Dubin. Alwyn Ren, MD,FACP,FCCP.   REASON FOR VISIT:  Patient with coronary disease.   HISTORY OF PRESENT ILLNESS:  The patient returns for followup. Following his  angioplasty in January, he did have some discomfort similar to previous.  This happened when he would breath cold air. He would have some epigastric  discomfort. He has not had any of this since about January. He has not been  as active I would hope. He denies any substernal chest pressure, neck  discomfort or arm discomfort. He has had no shortness of breath. He has no  palpitations, no presyncope, or syncope. He does not have any PND or  orthopnea. The discomfort that he did have was all down in his abdomen and  not at all classic for angina.   PAST MEDICAL HISTORY:  Coronary artery disease status post CABG (LAD  occluded and seen to fill via the LIMA, LAD was free of high-grade disease,  diagonal was occluded at the ostium and seen to fill the other vein graft,  the circumflex was occluded proximally at the left main. There was a large  branching mid obtuse that was noted to fill the vein graft. The inferior  branch which looked to run in  AV groove had a proximal 95% stenosis. The  right coronary had a long proximal 60% stenosis. There was a mid 99%  followed by a total occlusion of the RV branch. There was a large PDA filled  via collaterals from the LAD. The LIMA to the LAD was patent, saphenous vein  graft to the coronary artery was occluded at the ostium, saphenous vein  graft to the obtuse marginal had diffuse liminal irregularities with a 99%  anastomotic lesion. This was treated with cutting balloon. The saphenous  vein graft to diagonal had a hazy  area with about a 50% stenosis. The EF was  65%), hyperlipidemia, ongoing tobacco use, diabetes mellitus, obesity,  cataract surgery, right ankle surgery following a fracture.   ALLERGIES:  None.   CURRENT MEDICATIONS:  1. Aspirin 325 a day.  2. Amaryl 2 mg daily.  3. Metformin 1000 mg b.i.d.  4. Atenolol 25 mg b.i.d.  5. Advicor 1000/40.  6. Benazepril 20 mg daily.   REVIEW OF SYSTEMS:  As stated in the HPI otherwise negative for other  systems.   VITAL SIGNS:  Blood pressure 144/86, heart rate 62 and regular, weight 208  pounds, body mass index 31.  HEENT:  Eyes unremarkable. Pupils equal round and reactive to light, fundi  not visualized.  NECK:  No jugular venous distention, wave form within normal limits. Carotid  upstroke brisk and symmetric, no bruits, no thyromegaly.  LYMPHATICS:  No cervical, axillary or inguinal adenopathy.  LUNGS:  Clear to auscultation bilaterally.  BACK:  No costovertebral angle tenderness.  CHEST:  Well-healed sternotomy scar.  HEART:  PMI not displaced or sustained. S1 and S2 within normal limits. No  S3, no S4, no murmurs.  ABDOMEN:  Obese, positive bowel sounds, normal frequency and pitch. No  bruits, no rebound, no guarding and no midline pulsatile mass. No  organomegaly.  SKIN:  No rashes, no nodules.  EXTREMITIES:  2+ pulses throughout, no bruits, no cyanosis, no clubbing,  congenitally malformed left foot, trace left edema.  NEUROLOGIC:  Grossly intact.   EKG sinus rhythm, rate 63, axis within normal limits, interval is within  normal limits, no acute ST wave changes.   ASSESSMENT/PLAN:  1. Abdominal discomfort. This is atypical and I doubt represents ischemia.      He has no objective evidence of ischemia and therefore no further      cardiovascular testing is suggested. We will continue with aggressive      risk reduction.  2. Dyslipidemia. I did review his lipids drawn by Dr. Alwyn Ren. It noted his      LDL to be improved, still at  130. His HDL is 37.7. I have given him a      prescription for Zetia 10 mg daily. He has been given instructions to      get a lipid profile done in [redacted] weeks along with liver enzymes.  3. Tobacco. He was given a prescription for Chantix by Dr. Alwyn Ren and I      encouraged him to take this.  4. Hypertension. Blood pressure is slightly elevated. However, hopefully      with the weight loss he will bring this down. I did not adjust his      medications but this can be followed.  5. Followup. I will see him back in 6 months or sooner if needed.     Rollene Rotunda, MD, [facility]  Electronically Signed    JH/MedQ  DD: 07/02/2006  DT: 07/02/2006  Job #: [id number]   cc:   Titus Dubin. Alwyn Ren, MD,FACP,FCCP

## 2010-12-30 NOTE — Cardiovascular Report (Signed)
NAME:  James [name], James [name] NO.:  [id number]   MEDICAL RECORD NO.:  [id number]          PATIENT TYPE:  OIB   LOCATION:  6526                         FACILITY:  [facility]   PHYSICIAN:  Salvadore Farber, M.D. LHCDATE OF BIRTH:  Jan 26, 1935   DATE OF PROCEDURE:  09/01/2005  DATE OF DISCHARGE:  09/02/2005                              CARDIAC CATHETERIZATION   PROCEDURE:  Cutting balloon angioplasty of the distal anastomosis of the  saphenous vein graft to the obtuse marginal using Proxis embolic protection.   CARDIOLOGIST:  Salvadore Farber, M.D.   INDICATIONS:  James [name] is a 76 year old gentleman status post coronary  artery bypass grafting in 1992.  He presents with chest pain over the past  couple of months which has been occurring with less and less exertion.  This  led to diagnostic angiography by Dr. Roseanne Reno performed [id number], 2007.  That  study demonstrated preserved left ventricular systolic function with severe  native vessel disease.  The graft to the RCA was occluded and the distal RCA  was collateralized from the LAD.  The graft to the large obtuse marginal  branch had a 99% stenosis at the distal anastomosis.  The LIMA to LAD was  patent with excellent runoff.  I was asked to perform percutaneous  intervention on the anastomosis of the vein graft x-ray on the vein graft to  obtuse marginal.   DESCRIPTION OF PROCEDURE:  Informed consent was obtained.  The patient had  been preloaded on Plavix in the days prior to intervention.  Anticoagulation  was initiated with bivalirudin.  ACT was confirmed to be greater than 225  seconds.  A 7 French sheath was placed in the right common femoral artery  using the modified Seldinger technique.  A 7-French AL-1 guide was advanced  over the wire and engaged in the ostium of the vein graft to the marginal.  A Prowater wire was advanced into the midportion of the graft.  Over this, a  Proxis  embolic protection device was  advanced into the proximal portion of  the graft.  It could not be advanced more distally.  I then inflated the  Proxis balloon and  confirmed cessation of flow by gentle contrast  injection.  I then attempted to wire the lesion using a Prowater wire.  I  was not, however, able to wire with the minimal visualization available with  cessation of flow.  Since the symptoms did not suggest an acute lesion, I  elected to wire it with optimal visualized visualization without embolic  protection.  I therefore deflated the Proxis device.  I was able to advance  two separate Whisper wires extending both beyond the anastomosis, one  antegrade into the obtuse marginal and one retrograde into the obtuse  marginal.  I then worked over the wire extending retrograde. I then  reinflated the process device and then again confirmed cessation of flow.  I  performed cutting balloon angioplasty using a 2.25 x 6 mm cutting balloon at  8 atmospheres for two inflations.  This resulted small dissection which was  not flow  limiting within the distal portion of the vein graft.  I then  performed an additional inflation using a 2.25 x 12 mm Maverick at 6  atmospheres for 95 seconds.  With this, the dissection remained but there  was clearly no impeded  flow  I did consider stenting. However, there was a  very severe size mismatch between the distal portion of the vein graft and  the native vessel.  Stenting of the native vessel would have required a 2.0  or 2.25 mm stent.  The vein graft was at least 3.5 mm in diameter. We thus  would have ended up with substantial exposed metal.  I though that this  would longer high-term risk rather than the dissection post acutely.  I  therefore elected not to stent it.  The wires were then removed.  Repeat  angiography then confirmed TIMI-3 flow.  I should noted the Proxis  device  was inflated for each balloon inflation.  The patient tolerated the  procedure well and was  transferred to holding room in stable condition.   COMPLICATIONS:  None.   IMPRESSION/RECOMMENDATION:  Successful cutting balloon angioplasty of the  distal anastomosis of the saphenous vein graft to the obtuse marginal using  Proxis embolic protection.  This reduced the stenosis from 95% to less than  20%.  Will plan on treating with aspirin indefinitely.  The Plavix will be  discontinued.      Salvadore Farber, M.D. [facility]  Electronically Signed     WED/MEDQ  D:  09/01/2005  T:  09/02/2005  Job:  [id number]   cc:   Rollene Rotunda, M.D.  [address]  [address]  [place]  [place] [address]   Titus Dubin. Alwyn Ren, M.D. [facility]  [phone] [place]  [place] [address]

## 2011-01-03 ENCOUNTER — Ambulatory Visit (INDEPENDENT_AMBULATORY_CARE_PROVIDER_SITE_OTHER): Payer: Medicare Other | Admitting: *Deleted

## 2011-01-03 DIAGNOSIS — E538 Deficiency of other specified B group vitamins: Secondary | ICD-10-CM

## 2011-01-03 MED ORDER — CYANOCOBALAMIN 1000 MCG/ML IJ SOLN
1000.0000 ug | Freq: Once | INTRAMUSCULAR | Status: AC
  Administered 2011-01-03: 1000 ug via INTRAMUSCULAR

## 2011-01-11 ENCOUNTER — Ambulatory Visit (INDEPENDENT_AMBULATORY_CARE_PROVIDER_SITE_OTHER): Payer: Medicare Other | Admitting: *Deleted

## 2011-01-11 DIAGNOSIS — E538 Deficiency of other specified B group vitamins: Secondary | ICD-10-CM

## 2011-01-11 IMAGING — CR DG CHEST 2V
2 series · 2 of 2 positions shown · non-contrast
Comparison: 12/06/2007

CLINICAL DATA: Diabetes mellitus

CHEST - 2 VIEW

[view not recorded (1 of 2)]
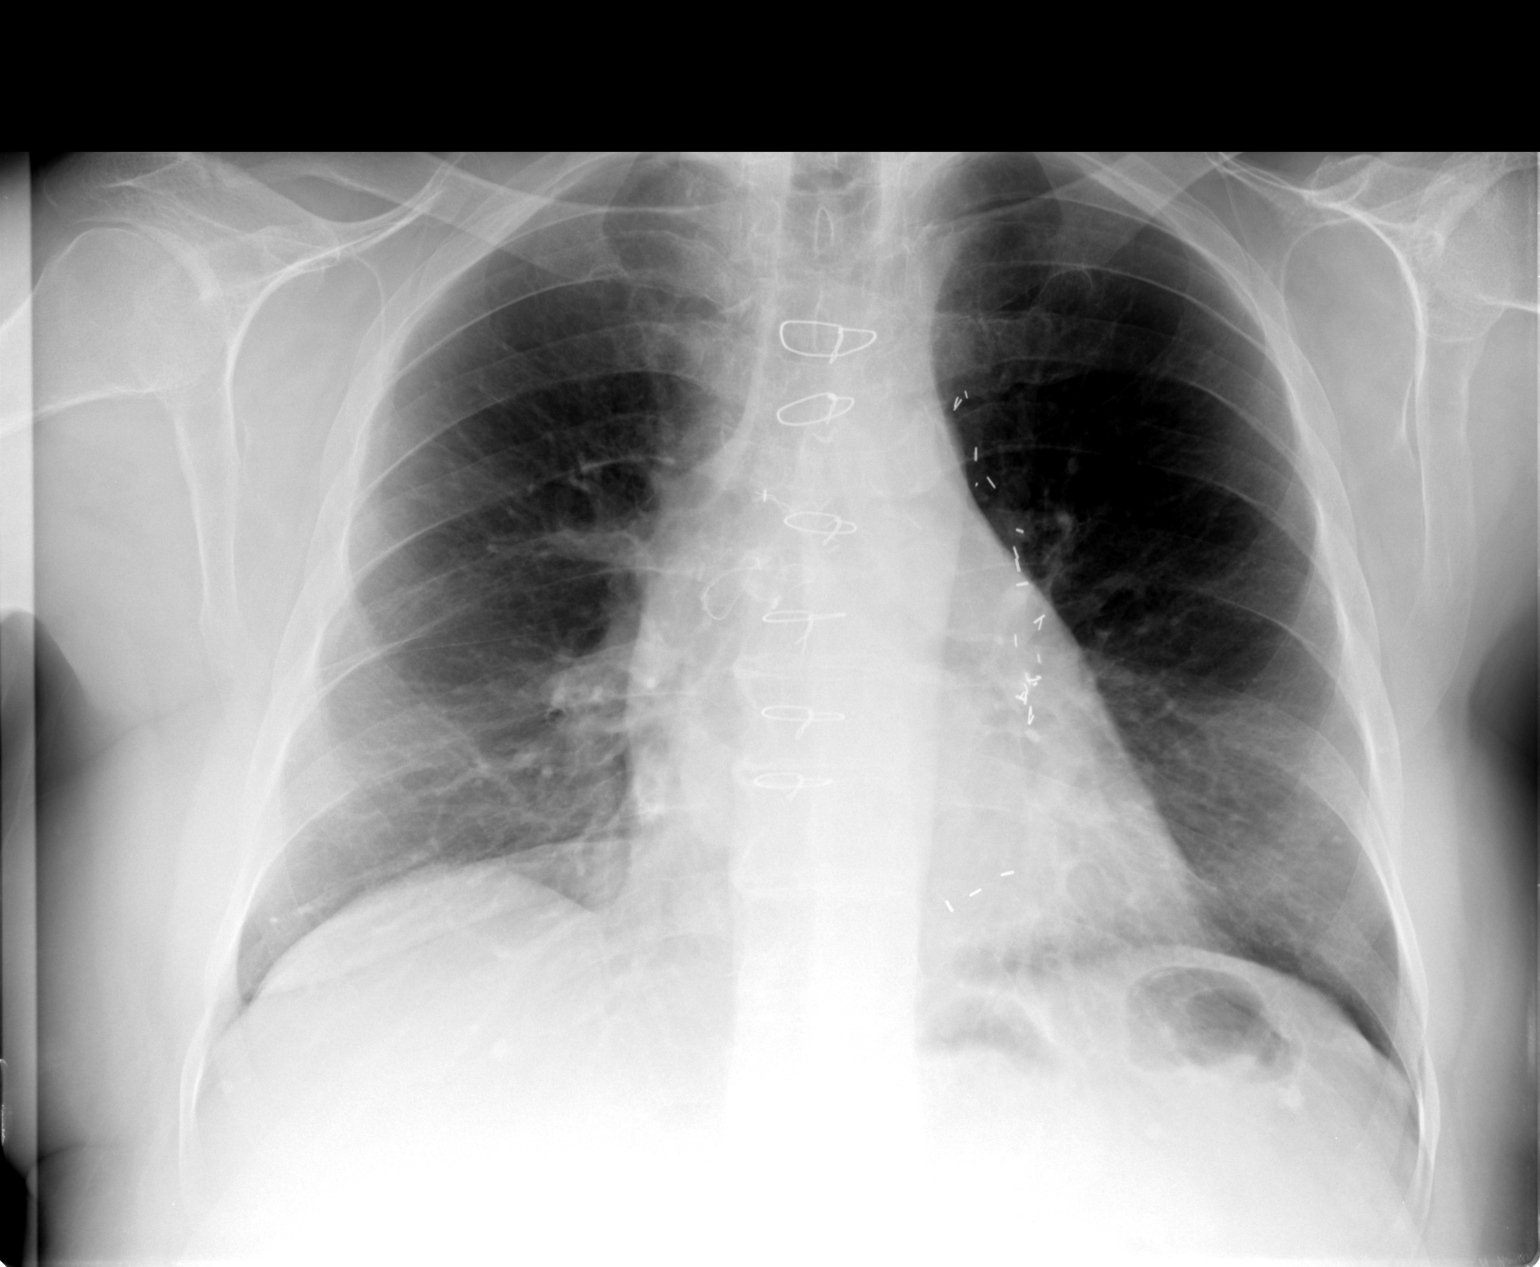

[view not recorded (2 of 2)]
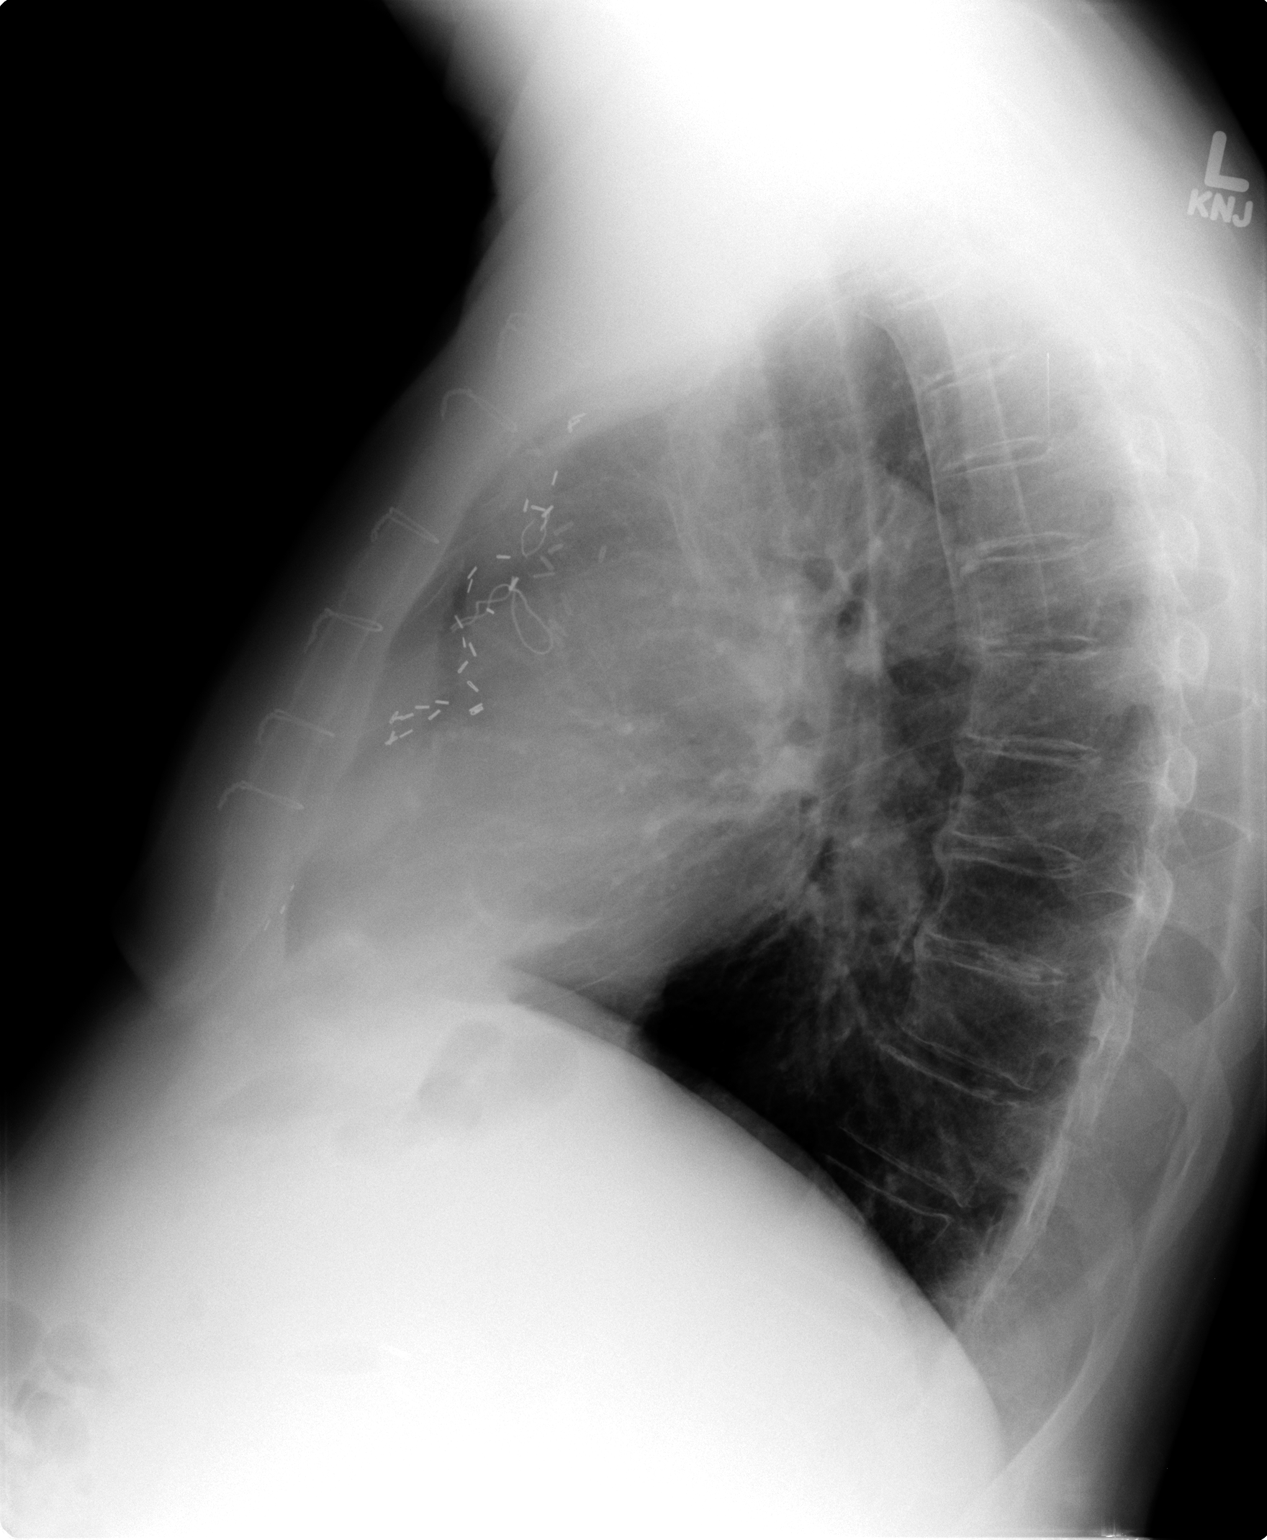

[2 of 2 positions shown; findings below may reference images not displayed]

FINDINGS: The heart is upper normal in size.  Postoperative changes
are noted.  Lungs are clear.  No pneumothorax.  No pleural
effusion.
IMPRESSION: No active cardiopulmonary disease.

## 2011-01-11 MED ORDER — CYANOCOBALAMIN 1000 MCG/ML IJ SOLN
1000.0000 ug | Freq: Once | INTRAMUSCULAR | Status: AC
  Administered 2011-01-11: 1000 ug via INTRAMUSCULAR

## 2011-01-18 ENCOUNTER — Ambulatory Visit (INDEPENDENT_AMBULATORY_CARE_PROVIDER_SITE_OTHER): Payer: Medicare Other | Admitting: *Deleted

## 2011-01-18 DIAGNOSIS — E538 Deficiency of other specified B group vitamins: Secondary | ICD-10-CM

## 2011-01-18 MED ORDER — CYANOCOBALAMIN 1000 MCG/ML IJ SOLN
1000.0000 ug | Freq: Once | INTRAMUSCULAR | Status: AC
  Administered 2011-01-18: 1000 ug via INTRAMUSCULAR

## 2011-03-22 ENCOUNTER — Ambulatory Visit: Payer: Medicare Other | Admitting: Internal Medicine

## 2011-03-23 ENCOUNTER — Encounter: Payer: Self-pay | Admitting: Internal Medicine

## 2011-03-23 ENCOUNTER — Ambulatory Visit (INDEPENDENT_AMBULATORY_CARE_PROVIDER_SITE_OTHER): Payer: Medicare Other | Admitting: Internal Medicine

## 2011-03-23 DIAGNOSIS — R319 Hematuria, unspecified: Secondary | ICD-10-CM

## 2011-03-23 DIAGNOSIS — N390 Urinary tract infection, site not specified: Secondary | ICD-10-CM

## 2011-03-23 LAB — POCT URINALYSIS DIPSTICK
Glucose, UA: NEGATIVE
Spec Grav, UA: 1.015
Urobilinogen, UA: 0.2

## 2011-03-23 MED ORDER — CIPROFLOXACIN HCL 500 MG PO TABS: 500.0000 mg | ORAL_TABLET | Freq: Two times a day (BID) | ORAL | Status: DC

## 2011-03-23 NOTE — Patient Instructions (Signed)
Force NON dairy fluids for next 48 hrs.  

## 2011-03-23 NOTE — Progress Notes (Signed)
  Subjective:    Patient ID: James [name], male    DOB: [birth year], 76 y.o.   MRN: [id number]  HPI DYSURIA: Onset:  2 weeks ago urgency, frequency   Worsening: last week  Symptoms Hesitancy: no  Hematuria: no  Flank Pain: no  Fever: no    Nausea/Vomiting: no  Discharge: no Red Flags  : (Risk Factors for Complicated UTI) Recent Antibiotic Usage (last 30 days): no  Symptoms lasting more than seven (7) days: yes,   More than 3 UTI's last 12 months: no, last in 1965  PMH of  1. DM: yes, 90-120 2. Renal Disease/Calculi: no 3. Urinary Tract Abnormality: no  4. Instrumentation/Trauma: no    Review of Systems     Objective:   Physical Exam he is in no acute distress.  Abdomen is soft without organomegaly or masses  He has no flank tenderness to percussion.  Using a live by himself without help  Prostate is normal on exam without nodularity enlargement  No skin lesions are present .He has no lymphadenopathy about the head, neck or axilla       Assessment & Plan:  #1 frequency and urgency with moderate Copus cells  and moderate blood on dip urine  Normal digital rectal exam  #2 diabetes well controlled based on glucose measurements.

## 2011-03-26 LAB — URINE CULTURE

## 2011-03-27 ENCOUNTER — Telehealth: Payer: Self-pay | Admitting: Internal Medicine

## 2011-03-27 DIAGNOSIS — N390 Urinary tract infection, site not specified: Secondary | ICD-10-CM

## 2011-03-27 DIAGNOSIS — R319 Hematuria, unspecified: Secondary | ICD-10-CM

## 2011-03-27 MED ORDER — CIPROFLOXACIN HCL 500 MG PO TABS: 500.0000 mg | ORAL_TABLET | Freq: Two times a day (BID) | ORAL | Status: AC

## 2011-03-27 NOTE — Telephone Encounter (Signed)
Received call from [facility] needs to have hard script faxed over so that they can administer pt's medication.

## 2011-05-09 LAB — CBC
HCT: 28.2 — ABNORMAL LOW
HCT: 31.2 — ABNORMAL LOW
HCT: 32.8 — ABNORMAL LOW
HCT: 34.2 — ABNORMAL LOW
Hemoglobin: 11.2 — ABNORMAL LOW
MCHC: 30.8
MCHC: 34.1
MCHC: 34.1
MCV: 89.6
MCV: 89.6
MCV: 90.3
Platelets: 208
Platelets: 240
Platelets: 242
Platelets: 253
Platelets: 285
Platelets: 288
Platelets: 315
RBC: 3.17 — ABNORMAL LOW
RBC: 3.6 — ABNORMAL LOW
RBC: 3.62 — ABNORMAL LOW
RBC: 3.82 — ABNORMAL LOW
RBC: 5.18
RDW: 14.7
RDW: 15.2
RDW: 15.7 — ABNORMAL HIGH
WBC: 11.5 — ABNORMAL HIGH
WBC: 17.9 — ABNORMAL HIGH
WBC: 26.6 — ABNORMAL HIGH

## 2011-05-09 LAB — COMPREHENSIVE METABOLIC PANEL
ALT: 124 — ABNORMAL HIGH
ALT: 92 — ABNORMAL HIGH
AST: 27
AST: 37
AST: 56 — ABNORMAL HIGH
Albumin: 1.8 — ABNORMAL LOW
Albumin: 1.8 — ABNORMAL LOW
Albumin: 2.2 — ABNORMAL LOW
Alkaline Phosphatase: 163 — ABNORMAL HIGH
BUN: 10
BUN: 13
CO2: 21
CO2: 25
CO2: 26
Calcium: 8.1 — ABNORMAL LOW
Calcium: 8.2 — ABNORMAL LOW
Chloride: 109
Chloride: 110
Chloride: 111
Creatinine, Ser: 0.87
Creatinine, Ser: 0.89
GFR calc Af Amer: >60
GFR calc Af Amer: >60
GFR calc Af Amer: >60
GFR calc non Af Amer: >60
GFR calc non Af Amer: >60
GFR calc non Af Amer: >60
Glucose, Bld: 227 — ABNORMAL HIGH
Potassium: 3.8
Sodium: 139
Sodium: 141
Total Bilirubin: 2.1 — ABNORMAL HIGH
Total Bilirubin: 2.6 — ABNORMAL HIGH
Total Bilirubin: 3.4 — ABNORMAL HIGH
Total Protein: 4.9 — ABNORMAL LOW

## 2011-05-09 LAB — BASIC METABOLIC PANEL
BUN: 14
BUN: 40 — ABNORMAL HIGH
CO2: 24
Calcium: 8.3 — ABNORMAL LOW
Calcium: 9.2
Chloride: 106
Creatinine, Ser: 0.95
Creatinine, Ser: 1.06
GFR calc Af Amer: >60
GFR calc Af Amer: >60
GFR calc non Af Amer: >60
Glucose, Bld: 83

## 2011-05-09 LAB — URINALYSIS, ROUTINE W REFLEX MICROSCOPIC
Glucose, UA: NEGATIVE
Hgb urine dipstick: NEGATIVE
Protein, ur: NEGATIVE
Specific Gravity, Urine: 1.023
pH: 5

## 2011-05-09 LAB — FOLATE: Folate: 9.1

## 2011-05-09 LAB — URINE MICROSCOPIC-ADD ON

## 2011-05-09 LAB — POCT CARDIAC MARKERS
Myoglobin, poc: >500
Operator id: 5362

## 2011-05-09 LAB — TSH: TSH: 15.65 — ABNORMAL HIGH

## 2011-05-09 LAB — HEPATIC FUNCTION PANEL
ALT: 69 — ABNORMAL HIGH
Alkaline Phosphatase: 224 — ABNORMAL HIGH
Bilirubin, Direct: 1.1 — ABNORMAL HIGH
Indirect Bilirubin: 1.4 — ABNORMAL HIGH
Total Bilirubin: 2.5 — ABNORMAL HIGH

## 2011-05-09 LAB — IRON AND TIBC
Saturation Ratios: 17 — ABNORMAL LOW
UIBC: 156

## 2011-05-09 LAB — DIFFERENTIAL: Neutrophils Relative %: 85 — ABNORMAL HIGH

## 2011-05-09 LAB — HEMOGLOBIN A1C
Hgb A1c MFr Bld: 6.7 — ABNORMAL HIGH
Mean Plasma Glucose: 161

## 2011-05-09 LAB — RETICULOCYTES: Retic Count, Absolute: 26.6

## 2011-05-09 LAB — HEMOGLOBIN AND HEMATOCRIT, BLOOD: HCT: 35 — ABNORMAL LOW

## 2011-05-09 LAB — CK TOTAL AND CKMB (NOT AT ARMC): Total CK: 330 — ABNORMAL HIGH

## 2011-05-15 ENCOUNTER — Other Ambulatory Visit: Payer: Self-pay | Admitting: Internal Medicine

## 2011-05-15 DIAGNOSIS — E569 Vitamin deficiency, unspecified: Secondary | ICD-10-CM

## 2011-05-15 DIAGNOSIS — E538 Deficiency of other specified B group vitamins: Secondary | ICD-10-CM

## 2011-05-15 DIAGNOSIS — D649 Anemia, unspecified: Secondary | ICD-10-CM

## 2011-05-16 ENCOUNTER — Other Ambulatory Visit (INDEPENDENT_AMBULATORY_CARE_PROVIDER_SITE_OTHER): Payer: Medicare Other

## 2011-05-16 DIAGNOSIS — D519 Vitamin B12 deficiency anemia, unspecified: Secondary | ICD-10-CM

## 2011-05-16 DIAGNOSIS — D518 Other vitamin B12 deficiency anemias: Secondary | ICD-10-CM

## 2011-05-16 DIAGNOSIS — E538 Deficiency of other specified B group vitamins: Secondary | ICD-10-CM

## 2011-05-16 MED ORDER — CYANOCOBALAMIN 1000 MCG/ML IJ SOLN
1000.0000 ug | Freq: Once | INTRAMUSCULAR | Status: AC
  Administered 2011-05-16: 1000 ug via INTRAMUSCULAR

## 2011-05-16 NOTE — Progress Notes (Signed)
Labs only

## 2011-08-22 ENCOUNTER — Ambulatory Visit (INDEPENDENT_AMBULATORY_CARE_PROVIDER_SITE_OTHER): Payer: Medicare Other | Admitting: Internal Medicine

## 2011-08-22 ENCOUNTER — Encounter: Payer: Self-pay | Admitting: Internal Medicine

## 2011-08-22 DIAGNOSIS — IMO0001 Reserved for inherently not codable concepts without codable children: Secondary | ICD-10-CM

## 2011-08-22 DIAGNOSIS — E039 Hypothyroidism, unspecified: Secondary | ICD-10-CM

## 2011-08-22 DIAGNOSIS — F17200 Nicotine dependence, unspecified, uncomplicated: Secondary | ICD-10-CM

## 2011-08-22 DIAGNOSIS — I10 Essential (primary) hypertension: Secondary | ICD-10-CM | POA: Insufficient documentation

## 2011-08-22 DIAGNOSIS — E785 Hyperlipidemia, unspecified: Secondary | ICD-10-CM

## 2011-08-22 DIAGNOSIS — R079 Chest pain, unspecified: Secondary | ICD-10-CM

## 2011-08-22 DIAGNOSIS — I2510 Atherosclerotic heart disease of native coronary artery without angina pectoris: Secondary | ICD-10-CM

## 2011-08-22 MED ORDER — NITROGLYCERIN 0.4 MG SL SUBL: 0.4000 mg | SUBLINGUAL_TABLET | SUBLINGUAL | Status: DC | PRN

## 2011-08-22 NOTE — Assessment & Plan Note (Signed)
He has had at least 2 episodes of exertional angina. Nitroglycerin will be prescribed for him to carry. Followup with his cardiologist is indicated.

## 2011-08-22 NOTE — Patient Instructions (Addendum)
Consider  [facility]'s smoking cessation program @ www.[place].com or 870-398-9849.   Please think about quitting smoking. Review the risks we discussed. Please call 1-800-QUIT-NOW (815 637 4375) for free smoking cessation counseling. .Share results with all medical care givers seen

## 2011-08-22 NOTE — Assessment & Plan Note (Signed)
Risks discussed; smoking as a precipitant for angina discussed as well.

## 2011-08-22 NOTE — Assessment & Plan Note (Signed)
TSH will be checked and dose changed if needed

## 2011-08-22 NOTE — Progress Notes (Signed)
  Subjective:    Patient ID: Trevell [name], male    DOB: [birth year], 77 y.o.   MRN: [id number]  HPI HYPERTENSION: Disease Monitoring: Blood pressure range-it is "good " @ [facility] Chest pain, palpitations-chest pain with incline early 12/12; resolution with rest       Dyspnea- no Medications: Compliance- yes  Lightheadedness,Syncope-no    Edema- no  DIABETES: Disease Monitoring: A1c 7% on 03/23/11 Blood Sugar ranges-FBS 130-140; no 2 hrs post  glucoses  Polyuria/phagia/dipsia-no       Visual problems- no;last  Ophth exam 10/12: no retinopathy Podiatry exam: 11/12; he  is being treated for plantar fasciitis with arch supports  Compliance- yes  Hypoglycemic symptoms-no  HYPERLIPIDEMIA: Disease Monitoring: See symptoms for Hypertension Medications: Compliance- yes Abd pain, bowel changes- occasional watery stool  Muscle aches- no  HYPOTHYROIDISM: TSH was 0.82 on 12/05/10.            Review of Systems  he has not seen his cardiologist for over a year; his cardiologist is unaware of his episodes of exertional chest pain.  He states his weight is stable. He denies significant fatigue. He also denies heat or cold intolerance.       Objective:   Physical Exam Gen.:  well-nourished in appearance. Alert, appropriate and cooperative throughout exam.  Eyes: No corneal or conjunctival inflammation noted.  Ears:  Hearing  Aids on R but communicated still impacted. Neck: No deformities, masses, or tenderness noted. Thyroid normal Lungs: Normal respiratory effort; chest expands symmetrically. Lungs are clear to auscultation without rales, wheezes, or increased work of breathing. Heart: Normal rate and rhythm. Normal S1 and S2. No gallop, click, or rub. Grade 1/6 systolic murmur  Abdomen: Bowel sounds normal; abdomen soft and nontender. No masses, organomegaly or hernias noted.                                                                                    Musculoskeletal/extremities:  No clubbing or cyanosis.  Chronic toe nail changes. He has one half edema at the ankles. He has chronic foot  deformities greater on the left foot. Postop changes are present as well  Vascular: Carotid, radial artery, dorsalis pedis and  posterior tibial pulses are full and equal. No bruits present. Neurologic: Alert and oriented x3. Deep tendon reflexes symmetrical and normal.          Skin: Intact without suspicious lesions or rashes. Psych: Mood and affect are normal. Normally interactive                                                                                         Assessment & Plan:

## 2011-08-22 NOTE — Assessment & Plan Note (Signed)
Fasting blood sugars appear to be at goal; status of  glucoses 2 hours postmeal is unclear. A1c will be checked.

## 2011-08-22 NOTE — Assessment & Plan Note (Signed)
He states his blood pressures were "good" at his [facility] facility. Renal function will be checked.

## 2011-08-23 LAB — BASIC METABOLIC PANEL
CO2: 26 mEq/L (ref 19–32)
Chloride: 110 mEq/L (ref 96–112)
GFR: 72.47 mL/min (ref >60.00)
Glucose, Bld: 113 mg/dL — ABNORMAL HIGH (ref 70–99)
Potassium: 5 mEq/L (ref 3.5–5.1)
Sodium: 143 mEq/L (ref 135–145)

## 2011-08-23 LAB — HEPATIC FUNCTION PANEL
ALT: 35 U/L (ref 0–53)
Bilirubin, Direct: 0 mg/dL (ref 0.0–0.3)
Total Protein: 7.4 g/dL (ref 6.0–8.3)

## 2011-08-23 LAB — LIPID PANEL
Cholesterol: 154 mg/dL (ref 0–200)
HDL: 43.1 mg/dL (ref >39.00)
Triglycerides: 276 mg/dL — ABNORMAL HIGH (ref 0.0–149.0)

## 2011-08-24 ENCOUNTER — Ambulatory Visit: Payer: Medicare Other | Admitting: Cardiology

## 2011-08-24 LAB — MICROALBUMIN / CREATININE URINE RATIO
Creatinine,U: 187.2 mg/dL
Microalb Creat Ratio: 0.5 mg/g (ref 0.0–30.0)

## 2011-08-29 ENCOUNTER — Telehealth: Payer: Self-pay | Admitting: Internal Medicine

## 2011-08-29 NOTE — Telephone Encounter (Signed)
Left message for patient to call me regarding lab and appt.  We need to schedule fasting labs. BMET, lipids, hepatic panel, TSH, A1c, urine microalbumin. OV 2-3 days later. (250.00, 272.4,995.20

## 2011-09-04 ENCOUNTER — Other Ambulatory Visit: Payer: Self-pay | Admitting: Internal Medicine

## 2011-09-04 DIAGNOSIS — T887XXA Unspecified adverse effect of drug or medicament, initial encounter: Secondary | ICD-10-CM

## 2011-09-04 DIAGNOSIS — E785 Hyperlipidemia, unspecified: Secondary | ICD-10-CM

## 2011-09-04 DIAGNOSIS — E119 Type 2 diabetes mellitus without complications: Secondary | ICD-10-CM

## 2011-09-05 ENCOUNTER — Telehealth: Payer: Self-pay | Admitting: *Deleted

## 2011-09-05 ENCOUNTER — Other Ambulatory Visit (INDEPENDENT_AMBULATORY_CARE_PROVIDER_SITE_OTHER): Payer: Medicare Other

## 2011-09-05 DIAGNOSIS — T887XXA Unspecified adverse effect of drug or medicament, initial encounter: Secondary | ICD-10-CM

## 2011-09-05 DIAGNOSIS — E119 Type 2 diabetes mellitus without complications: Secondary | ICD-10-CM

## 2011-09-05 DIAGNOSIS — E785 Hyperlipidemia, unspecified: Secondary | ICD-10-CM

## 2011-09-05 LAB — HEMOGLOBIN A1C: Hgb A1c MFr Bld: 8.2 % — ABNORMAL HIGH (ref 4.6–6.5)

## 2011-09-05 LAB — LIPID PANEL
LDL Cholesterol: 78 mg/dL (ref 0–99)
VLDL: 31.8 mg/dL (ref 0.0–40.0)

## 2011-09-05 LAB — HEPATIC FUNCTION PANEL
ALT: 32 U/L (ref 0–53)
AST: 26 U/L (ref 0–37)
Albumin: 4.4 g/dL (ref 3.5–5.2)
Total Bilirubin: 0.6 mg/dL (ref 0.3–1.2)

## 2011-09-05 LAB — BASIC METABOLIC PANEL
BUN: 29 mg/dL — ABNORMAL HIGH (ref 6–23)
Chloride: 109 mEq/L (ref 96–112)
Glucose, Bld: 122 mg/dL — ABNORMAL HIGH (ref 70–99)
Potassium: 6.1 mEq/L (ref 3.5–5.1)

## 2011-09-05 LAB — TSH: TSH: 2.46 u[IU]/mL (ref 0.35–5.50)

## 2011-09-05 LAB — MICROALBUMIN / CREATININE URINE RATIO
Creatinine,U: 237.8 mg/dL
Microalb Creat Ratio: 0.5 mg/g (ref 0.0–30.0)

## 2011-09-05 NOTE — Telephone Encounter (Signed)
Discuss with patient, appt schedule. 

## 2011-09-05 NOTE — Telephone Encounter (Signed)
Lab reports patient's Potassium result 6.1 [sample not hemolyzed]-repeated at 6.2 Faxing report to office.

## 2011-09-05 NOTE — Telephone Encounter (Signed)
To prevent   Potassium (K+)elevation stop citrus fruits & bananas in diet .  Make sure he is not on Aldactone (Spirononlactone).Recheck K+ in 1  week . [employer]

## 2011-09-07 ENCOUNTER — Encounter: Payer: Self-pay | Admitting: Cardiology

## 2011-09-07 ENCOUNTER — Ambulatory Visit (INDEPENDENT_AMBULATORY_CARE_PROVIDER_SITE_OTHER): Payer: Medicare Other | Admitting: Cardiology

## 2011-09-07 VITALS — BP 125/69 | HR 72 | Ht 70.0 in | Wt 209.0 lb

## 2011-09-07 DIAGNOSIS — E785 Hyperlipidemia, unspecified: Secondary | ICD-10-CM

## 2011-09-07 DIAGNOSIS — I10 Essential (primary) hypertension: Secondary | ICD-10-CM

## 2011-09-07 DIAGNOSIS — I2510 Atherosclerotic heart disease of native coronary artery without angina pectoris: Secondary | ICD-10-CM

## 2011-09-07 MED ORDER — ATENOLOL 50 MG PO TABS: 50.0000 mg | ORAL_TABLET | Freq: Every day | ORAL | Status: DC

## 2011-09-07 NOTE — Assessment & Plan Note (Signed)
His last LDL was within target. He will continue meds as listed.

## 2011-09-07 NOTE — Assessment & Plan Note (Signed)
We discussed the fact that his hemoglobin A1c is greater than 8.  He understands the need for better blood sugar control.

## 2011-09-07 NOTE — Assessment & Plan Note (Signed)
He has chest pain with significant exertion. At this point I will increase his beta blocker. We had a long discussion about reporting any increased symptoms to me. If he does have more chest discomfort with less exertion I will have a low threshold for repeat stress testing. His last stress test was in 2011.

## 2011-09-07 NOTE — Progress Notes (Signed)
HPI The patient presents for yearly followup. Since I last saw him he has had 2 episodes of chest discomfort with exertion. This has happened when he has done something such as walk up an incline or walked briskly outside. He's not having this with his usual activities. He's not having any resting complaints. The discomfort has been substernal and goes away quickly when he stops his activities. He does not describe associated symptoms. He's not having any resting shortness of breath, PND or orthopnea. He's had no palpitations, presyncope or syncope. He's had no weight gain or edema.  No Known Allergies  Current Outpatient Prescriptions  Medication Sig Dispense Refill  . amylase-lipase-protease (PANCREASE MT 4) 07-18-11 MU per capsule Take 1 capsule by mouth as needed. 1 pill by mouth before breakfast to prevent diarrhea       . atenolol (TENORMIN) 25 MG tablet Take 25 mg by mouth daily.        . benazepril (LOTENSIN) 20 MG tablet Take 20 mg by mouth daily.        Marland Kitchen glimepiride (AMARYL) 2 MG tablet Take 2 mg by mouth daily before breakfast.        . levothyroxine (SYNTHROID, LEVOTHROID) 100 MCG tablet Take 100 mcg by mouth daily. 1 by mouth daily except 1 1/2 every Wed       . metFORMIN (GLUCOPHAGE) 1000 MG tablet Take 1,000 mg by mouth 2 (two) times daily with a meal.        . metroNIDAZOLE (METROGEL) 1 % gel Apply 1 application topically daily.        . nitroGLYCERIN (NITROSTAT) 0.4 MG SL tablet Place 1 tablet (0.4 mg total) under the tongue every 5 (five) minutes as needed for chest pain.  30 tablet  3  . Pancrelipase, Lip-Prot-Amyl, (PANCREAZE) 4200 UNITS CPEP Take 4.2 capsules by mouth 1 day or 1 dose.      . simvastatin (ZOCOR) 40 MG tablet Take 40 mg by mouth at bedtime.          Past Medical History  Diagnosis Date  . DM (diabetes mellitus)   . Other and unspecified hyperlipidemia   . HTN (hypertension)   . CAD (coronary artery disease)   . Unspecified hypothyroidism     Past  Surgical History  Procedure Date  . Angioplasty 08/2006    Dr.Haislee Corso  . Colonoscopy w/ polypectomy 2005  . Coronary artery bypass graft 1993  . Tonsillectomy   . Foot surgery     Multiple surgeries for congenital deformities     ROS:   As stated in the HPI and negative for all other systems.   PHYSICAL EXAM BP 125/69  Pulse 72  Ht 5\' 10"  (1.778 m)  Wt 209 lb (94.802 kg)  BMI 29.99 kg/m2 GENERAL:  Well appearing HEENT:  Pupils equal round and reactive, fundi not visualized, oral mucosa unremarkable NECK:  No jugular venous distention, waveform within normal limits, carotid upstroke brisk and symmetric, no bruits, no thyromegaly, left swollen submandibular gland LYMPHATICS:  No cervical, inguinal adenopathy LUNGS:  Clear to auscultation bilaterally BACK:  No CVA tenderness CHEST:  Well healed sternotomy scar. HEART:  PMI not displaced or sustained,S1 and S2 withi1/8/n normal limits, no S3, no S4, no clicks, no rubs, no murmurs ABD:  Flat, positive bowel sounds normal in frequency in pitch, no bruits, no rebound, no guarding, no midline pulsatile mass, no hepatomegaly, no splenomegaly EXT:  2 plus pulses throughout, no edema, no cyanosis no clubbing SKIN:  No rashes no nodules NEURO:  Cranial nerves II through XII grossly intact, motor grossly intact throughout [facility]:  Cognitively intact, oriented to person place and time  EKG:    08/22/11  Sinus rhythm, rate 67, axis within normal limits, intervals within normal limits, no acute ST-T wave changes.  ASSESSMENT AND PLAN

## 2011-09-07 NOTE — Patient Instructions (Addendum)
Increase your Atenolol to 50 mg a day Continue all other medications as listed  Follow up in 3 months with Dr [name].  You will receive a letter in the mail 2 months before you are due.  Please call us when you receive this letter to schedule your follow up appointment.

## 2011-09-07 NOTE — Assessment & Plan Note (Signed)
The blood pressure is at target. No change in medications is indicated. We will continue with therapeutic lifestyle changes (TLC).  

## 2011-09-08 ENCOUNTER — Encounter: Payer: Self-pay | Admitting: Internal Medicine

## 2011-09-08 ENCOUNTER — Ambulatory Visit (INDEPENDENT_AMBULATORY_CARE_PROVIDER_SITE_OTHER): Payer: Medicare Other | Admitting: Internal Medicine

## 2011-09-08 ENCOUNTER — Encounter: Payer: Self-pay | Admitting: Cardiology

## 2011-09-08 DIAGNOSIS — F17200 Nicotine dependence, unspecified, uncomplicated: Secondary | ICD-10-CM

## 2011-09-08 NOTE — Progress Notes (Signed)
  Subjective:    Patient ID: James [name], male    DOB: 1935-01-05, 77 y.o.   MRN: [id number]  HPI Labs were reviewed; a major concern was  potassium of 6.1. Creatinine is 1.4.He is on an ACE inhibitor; I cannot identify any other medication which would cause hyperkalemia. Specifically he is not on Aldactone (spironolactone) . He has been ingesting increased amounts of citrus fruits or during the winter; he denies using any potassium-containing salt substitute. Diabetes is poorly controlled 8.2%; this would be associated with an average sugar of 169 and increased risk of approximately 65%  for stroke or heart attack. This value  is essentially stable. FBS 110- 150; no 2 hr post meal glucoses. No hypoglycemia.  He's had no recurrent exertional chest pain. He has seen his cardiologist.     Review of Systems  He is wearing supports on his feet to treat plantar fasciitis.     Objective:   Physical Exam he is in no distress and appears well-nourished.  Sounds are decreased; there is no increased work of breathing. Heart rhythm is regular with an S4.  No carotid bruits are present. Radial artery pulses are equal and full.  Pedal pulses are decreased.        Assessment & Plan:    #1 hyperkalemia. This will be rechecked; if it is progressive the ACE inhibitor may need to be discontinued. Blood pressure is well-controlled at this time. It is stable or decreased; dietary changes would be introduced. Specifically decrease in citrus products will be recommended.

## 2011-09-08 NOTE — Assessment & Plan Note (Signed)
Minimal A1c goal = 8 percent; ideally , 7%. Rather than increase medications; nutritional intervention will be stressed. A1c should be checked in 6-8 weeks. The risk associated with this A1c value were discussed.

## 2011-09-08 NOTE — Assessment & Plan Note (Signed)
Risks discussed 

## 2011-09-08 NOTE — Patient Instructions (Signed)
Eat a low-fat diet with lots of fruits and vegetables, up to 7-9 servings per day. Consume less than 40 ( preferably ZERO) grams of sugar per day from foods & drinks with High Fructose Corn Syrup (HFCS) sugar as #1,2,3 or # 4 on label.Whole Foods, [employer] & [employer] do not carry products with HFCS. Follow a  low carb nutrition program such as [place] or [employer]  to prevent Diabetes progression . [place] carbohydrates (potatoes, rice, bread, and pasta) have a high spike of sugar and a high load of sugar. For example a  baked potato has a cup of sugar and a  french fry  2 teaspoons of sugar. Yams, wild  rice, whole grained bread &  wheat pasta have been much lower spike and load of  sugar. Portions should be the size of a deck of cards or your palm.  Consider  [facility]'s smoking cessation program @ www.[place].com or (859)286-6757. PLEASE BRING THESE INSTRUCTIONS TO FOLLOW UP  LAB APPOINTMENT for A1c in 6-8 weeks after nutrition interventions above.This will guarantee correct labs are drawn, eliminating need for repeat blood sampling ( needle sticks ! ). Diagnoses /Codes: 250.02

## 2011-09-08 NOTE — Progress Notes (Signed)
  Subjective:    Patient ID: James [name], male    DOB: 1935/01/18, 77 y.o.   MRN: [id number]  HPI    Review of Systems     Objective:   Physical Exam        Assessment & Plan:  If the swelling inf the left masseter area persists; imaging may be necessary.

## 2011-09-08 NOTE — Progress Notes (Signed)
  Subjective:    Patient ID: James [name], male    DOB: 06-Apr-1935, 77 y.o.   MRN: [id number]  HPI    Review of Systems     Objective:   Physical Exam  There is some slight swelling and tenderness over the left parotid area w/o LA        Assessment & Plan:    Clinically possible low-grade parotid inflammation/ engorgement is suggested. This may be related to increased amounts of citrus ingested.  Warm compresses 3 times a day and decrease in citrus ingestion was discussed. If the left master swelling persist bone imaging will be necessary.

## 2011-09-11 ENCOUNTER — Other Ambulatory Visit: Payer: Self-pay | Admitting: Internal Medicine

## 2011-09-11 DIAGNOSIS — E875 Hyperkalemia: Secondary | ICD-10-CM

## 2011-09-12 ENCOUNTER — Other Ambulatory Visit (INDEPENDENT_AMBULATORY_CARE_PROVIDER_SITE_OTHER): Payer: Medicare Other

## 2011-09-12 DIAGNOSIS — E875 Hyperkalemia: Secondary | ICD-10-CM

## 2011-11-27 ENCOUNTER — Ambulatory Visit: Payer: Medicare Other | Admitting: Cardiology

## 2011-12-11 ENCOUNTER — Ambulatory Visit (INDEPENDENT_AMBULATORY_CARE_PROVIDER_SITE_OTHER): Payer: Medicare Other | Admitting: Cardiology

## 2011-12-11 ENCOUNTER — Encounter: Payer: Self-pay | Admitting: Cardiology

## 2011-12-11 VITALS — BP 140/79 | HR 76 | Ht 70.0 in | Wt 211.0 lb

## 2011-12-11 DIAGNOSIS — I2510 Atherosclerotic heart disease of native coronary artery without angina pectoris: Secondary | ICD-10-CM

## 2011-12-11 DIAGNOSIS — E663 Overweight: Secondary | ICD-10-CM

## 2011-12-11 DIAGNOSIS — I10 Essential (primary) hypertension: Secondary | ICD-10-CM

## 2011-12-11 NOTE — Progress Notes (Signed)
HPI The patient presents for follow up.  Since I last saw him he has done well.  The patient denies any new symptoms such as chest discomfort, neck or arm discomfort. There has been no new shortness of breath, PND or orthopnea. There have been no reported palpitations, presyncope or syncope.  He even walked at the [facility] recently without discomfort. He has not taken any nitroglycerin.  No Known Allergies  Current Outpatient Prescriptions  Medication Sig Dispense Refill  . amylase-lipase-protease (PANCREASE MT 4) 07-18-11 MU per capsule Take 1 capsule by mouth as needed. 1 pill by mouth before breakfast to prevent diarrhea       . atenolol (TENORMIN) 50 MG tablet Take 1 tablet (50 mg total) by mouth daily.  30 tablet  11  . benazepril (LOTENSIN) 20 MG tablet Take 20 mg by mouth daily.        Marland Kitchen glimepiride (AMARYL) 2 MG tablet Take 2 mg by mouth daily before breakfast.        . levothyroxine (SYNTHROID, LEVOTHROID) 100 MCG tablet Take 100 mcg by mouth daily. 1 by mouth daily except 1 1/2 every Wed       . metFORMIN (GLUCOPHAGE) 1000 MG tablet Take 1,000 mg by mouth 2 (two) times daily with a meal.        . metroNIDAZOLE (METROGEL) 1 % gel Apply 1 application topically daily.        . nitroGLYCERIN (NITROSTAT) 0.4 MG SL tablet Place 1 tablet (0.4 mg total) under the tongue every 5 (five) minutes as needed for chest pain.  30 tablet  3  . Pancrelipase, Lip-Prot-Amyl, (PANCREAZE) 4200 UNITS CPEP Take 4.2 capsules by mouth 1 day or 1 dose.      . simvastatin (ZOCOR) 40 MG tablet Take 40 mg by mouth at bedtime.          Past Medical History  Diagnosis Date  . DM (diabetes mellitus)   . Other and unspecified hyperlipidemia   . HTN (hypertension)   . CAD (coronary artery disease)   . Unspecified hypothyroidism     Past Surgical History  Procedure Date  . Angioplasty 08/2006    Dr.Davidlee Jeanbaptiste  . Colonoscopy w/ polypectomy 2005  . Coronary artery bypass graft 1993  . Tonsillectomy   . Foot  surgery     Multiple surgeries for congenital deformities     ROS:   As stated in the HPI and negative for all other systems.   PHYSICAL EXAM BP 140/79  Pulse 76  Ht 5\' 10"  (1.778 m)  Wt 211 lb (95.709 kg)  BMI 30.28 kg/m2 GENERAL:  Well appearing HEENT:  Pupils equal round and reactive, fundi not visualized, oral mucosa unremarkable NECK:  No jugular venous distention, waveform within normal limits, carotid upstroke brisk and symmetric, no bruits, no thyromegaly, left swollen submandibular gland LYMPHATICS:  No cervical, inguinal adenopathy LUNGS:  Clear to auscultation bilaterally BACK:  No CVA tenderness CHEST:  Well healed sternotomy scar. HEART:  PMI not displaced or sustained,S1 and S2 withi1/8/n normal limits, no S3, no S4, no clicks, no rubs, no murmurs ABD:  Flat, positive bowel sounds normal in frequency in pitch, no bruits, no rebound, no guarding, no midline pulsatile mass, no hepatomegaly, no splenomegaly, obese EXT:  2 plus pulses throughout, no edema, no cyanosis no clubbing SKIN:  No rashes no nodules NEURO:  Cranial nerves II through XII grossly intact, motor grossly intact throughout PSYCH:  Cognitively intact, oriented to person place and time  EKG:    08/22/11  Sinus rhythm, rate 67, axis within normal limits, intervals within normal limits, no acute ST-T wave changes.  ASSESSMENT AND PLAN

## 2011-12-11 NOTE — Patient Instructions (Signed)
The current medical regimen is effective;  continue present plan and medications.  Follow up in 6 months with Dr Hochrein.  You will receive a letter in the mail 2 months before you are due.  Please call us when you receive this letter to schedule your follow up appointment.  

## 2011-12-11 NOTE — Assessment & Plan Note (Addendum)
The blood pressure is at target. No change in medications is indicated. We will continue with therapeutic lifestyle changes (TLC).  

## 2011-12-11 NOTE — Assessment & Plan Note (Signed)
At this point with no new symptoms and a stress test in 2011 I would not think further cardiovascular testing is suggested. Continue with risk reduction.

## 2011-12-11 NOTE — Assessment & Plan Note (Signed)
We discussed weight loss with diet and exercise.

## 2011-12-20 ENCOUNTER — Other Ambulatory Visit: Payer: Self-pay | Admitting: Internal Medicine

## 2011-12-20 MED ORDER — METRONIDAZOLE 1 % EX GEL: 1.0000 "application " | Freq: Every day | CUTANEOUS | Status: DC

## 2011-12-20 NOTE — Telephone Encounter (Signed)
Refill Metrogel 1% gel Qty 60 gram Apply to rash once daily after cleansing, patient may apply  Requesting 11-refills   Last filled 02.11.11 Last OV 1.25.13

## 2011-12-20 NOTE — Telephone Encounter (Signed)
RX sent

## 2012-02-05 DIAGNOSIS — Z0279 Encounter for issue of other medical certificate: Secondary | ICD-10-CM

## 2012-02-06 ENCOUNTER — Telehealth: Payer: Self-pay | Admitting: Internal Medicine

## 2012-02-06 NOTE — Telephone Encounter (Signed)
Message copied by Marshell Garfinkel on Tue Feb 06, 2012  8:27 AM ------      Message from: Pecola Lawless      Created: Sun Feb 04, 2012  6:57 AM       Please schedule followup appointment in 10-14 days. Please bring printed med list or  all actual pill bottles and supplements.

## 2012-02-06 NOTE — Telephone Encounter (Signed)
Pt is scheduled for 02/20/12 and was instructed to bring all meds or med list.

## 2012-02-10 DIAGNOSIS — Z0279 Encounter for issue of other medical certificate: Secondary | ICD-10-CM

## 2012-02-20 ENCOUNTER — Encounter: Payer: Self-pay | Admitting: Internal Medicine

## 2012-02-20 ENCOUNTER — Ambulatory Visit (INDEPENDENT_AMBULATORY_CARE_PROVIDER_SITE_OTHER): Payer: Medicare Other | Admitting: Internal Medicine

## 2012-02-20 VITALS — BP 124/80 | HR 70 | Wt 207.0 lb

## 2012-02-20 DIAGNOSIS — I10 Essential (primary) hypertension: Secondary | ICD-10-CM

## 2012-02-20 DIAGNOSIS — E785 Hyperlipidemia, unspecified: Secondary | ICD-10-CM

## 2012-02-20 DIAGNOSIS — IMO0001 Reserved for inherently not codable concepts without codable children: Secondary | ICD-10-CM

## 2012-02-20 DIAGNOSIS — E039 Hypothyroidism, unspecified: Secondary | ICD-10-CM

## 2012-02-20 DIAGNOSIS — F17200 Nicotine dependence, unspecified, uncomplicated: Secondary | ICD-10-CM

## 2012-02-20 NOTE — Patient Instructions (Addendum)
Please try to go on My Chart within the next 24 hours to allow me to release the results directly to you.Share results with all medical staff seen

## 2012-02-20 NOTE — Assessment & Plan Note (Signed)
A1c was 8.2% on 09/05/11; minimal goal is 8%. This will be rechecked with urine microalbumin and kidney function. Creatinine was upper limits normal  @1 .4 in January.

## 2012-02-20 NOTE — Assessment & Plan Note (Signed)
TSH was therapeutic at 2.46 in January. It should be rechecked in January 2014

## 2012-02-20 NOTE — Assessment & Plan Note (Signed)
He expresses no interest in smoking cessation at this time

## 2012-02-20 NOTE — Assessment & Plan Note (Signed)
Blood pressure control appears to be excellent. Creatinine had risen from 1.1-1.4 in January. Renal function will be rechecked

## 2012-02-20 NOTE — Assessment & Plan Note (Signed)
lipids were at goal in January of this year; LDL was 78 and HDL 44.4. Recheck in weight until January 2014 unless diabetic control worsens

## 2012-02-20 NOTE — Progress Notes (Signed)
  Subjective:    Patient ID: James [name], male    DOB: 06/09/1935, 77 y.o.   MRN: [id number]  HPI HYPERTENSION: Disease Monitoring: Blood pressure range-"good" @ facility  Chest pain, palpitations- no ; he saw Dr [name] 12/11/11       Dyspnea- no Medications: Compliance- yes Lightheadedness,Syncope-no    Edema- no  DIABETES: Disease Monitoring: Blood Sugar ranges-FBS <100-140  Polyuria/phagia/dipsia- no       Visual problems- no Podiatry F/U - seen last week Ophth exam-10/12; no retinopathy Medications: Compliance- yes  Hypoglycemic symptoms- no  HYPERLIPIDEMIA: Disease Monitoring: See symptoms for Hypertension Medications: Compliance- yes  Abd pain, bowel changes-no   Muscle aches- no    Smoking Status : < 1/4 ppd       Review of Systems Constitutional:no fever, chills, sweats,significant  weight change, fatigue, sleep issues GI:no change in bowels, anorexia Derm: no skin, hair, or nail changes Neurologic: tremor stable; no numbness and tingling Psych:no significant anxiety, depression Endocrine:no hoarseness, temperature intolerance      Objective:   Physical Exam Gen.:  well-nourished in appearance. Alert, appropriate and cooperative throughout exam. Head: Normocephalic without obvious abnormalities;  pattern alopecia  Eyes: Hearing aid on R. Nose: External nasal exam reveals no deformity or inflammation. Nasal mucosa are pink and moist.Hyponasal speech No lesions or exudates noted.   Mouth: Oral mucosa and oropharynx reveal no lesions or exudates. Upper plate. Neck: No deformities, masses, or tenderness noted.  Thyroid normal. Lungs: Normal respiratory effort; chest expands symmetrically. Lungs are clear to auscultation without rales, wheezes, or increased work of breathing. Heart: Normal rate and rhythm. Normal S1 and S2. No gallop, click, or rub. S4 ; Grade 1/2 systolic  murmur. Abdomen: Bowel sounds normal; abdomen soft and nontender. No masses,  organomegaly or hernias noted.                                                                                 Vascular: Carotid, radial artery, dorsalis pedis and  posterior tibial pulses are full and equal. No bruits present. Neurologic: Alert and oriented x3. Deep tendon reflexes symmetrical and normal.          Skin: Intact without suspicious lesions or rashes. Lymph: No cervical, axillary lymphadenopathy present. Psych: Mood and affect are normal. Normally interactive                                                                                         Assessment & Plan:

## 2012-02-21 LAB — CREATININE, SERUM: Creatinine, Ser: 1.1 mg/dL (ref 0.4–1.5)

## 2012-02-21 LAB — MICROALBUMIN / CREATININE URINE RATIO
Creatinine,U: 153.4 mg/dL
Microalb, Ur: 0.9 mg/dL (ref 0.0–1.9)

## 2012-04-04 ENCOUNTER — Telehealth: Payer: Self-pay | Admitting: Internal Medicine

## 2012-04-04 NOTE — Telephone Encounter (Signed)
B12 level was 248 on 05/16/11, last ov 02/20/2012, Dr.Hopper please advise if b12 level to be rechecked now or ok to wait until October of this year. When b12 level is checked if low, ? If patient to restart injection

## 2012-04-04 NOTE — Telephone Encounter (Signed)
Left message to call office

## 2012-04-04 NOTE — Telephone Encounter (Signed)
He's been off the B12 shots; this was to have been  checked last year. The  level should be rechecked to see if theshots need to be restarted. Diagnosis 281.0

## 2012-04-04 NOTE — Telephone Encounter (Signed)
[name] from [facility] would like to know if they should recheck the pts b12 level. He has not had it checked since 05/2011. Also, they would like to know what you want to do if normal/abanormal. Start injections? Please advise.

## 2012-04-05 MED ORDER — AMBULATORY NON FORMULARY MEDICATION: Status: DC

## 2012-04-05 NOTE — Telephone Encounter (Signed)
[name] called back, they can check B12 level at there facility. Fax order to [phone]. (labs are only checked on Tuesday)

## 2012-04-11 NOTE — Telephone Encounter (Signed)
Left message on voicemail for [name] informing her Per Dr.Hopper patient to continue b12 injections:  Level 170 (ref range 211-911)

## 2012-04-18 ENCOUNTER — Encounter: Payer: Self-pay | Admitting: Internal Medicine

## 2012-05-09 ENCOUNTER — Encounter: Payer: Self-pay | Admitting: Internal Medicine

## 2012-05-09 ENCOUNTER — Ambulatory Visit (INDEPENDENT_AMBULATORY_CARE_PROVIDER_SITE_OTHER): Payer: Medicare Other | Admitting: Internal Medicine

## 2012-05-09 VITALS — BP 124/80 | HR 66 | Wt 204.6 lb

## 2012-05-09 DIAGNOSIS — Z23 Encounter for immunization: Secondary | ICD-10-CM

## 2012-05-09 DIAGNOSIS — E1142 Type 2 diabetes mellitus with diabetic polyneuropathy: Secondary | ICD-10-CM

## 2012-05-09 DIAGNOSIS — E1140 Type 2 diabetes mellitus with diabetic neuropathy, unspecified: Secondary | ICD-10-CM

## 2012-05-09 DIAGNOSIS — E162 Hypoglycemia, unspecified: Secondary | ICD-10-CM

## 2012-05-09 DIAGNOSIS — E538 Deficiency of other specified B group vitamins: Secondary | ICD-10-CM

## 2012-05-09 DIAGNOSIS — E1149 Type 2 diabetes mellitus with other diabetic neurological complication: Secondary | ICD-10-CM

## 2012-05-09 DIAGNOSIS — E119 Type 2 diabetes mellitus without complications: Secondary | ICD-10-CM

## 2012-05-09 DIAGNOSIS — E109 Type 1 diabetes mellitus without complications: Secondary | ICD-10-CM

## 2012-05-09 LAB — GLUCOSE, POCT (MANUAL RESULT ENTRY): POC Glucose: 79 mg/dl (ref 70–99)

## 2012-05-09 MED ORDER — CYANOCOBALAMIN 1000 MCG/ML IJ SOLN
1000.0000 ug | Freq: Once | INTRAMUSCULAR | Status: AC
  Administered 2012-05-09: 1000 ug via INTRAMUSCULAR

## 2012-05-09 MED ORDER — CYANOCOBALAMIN 1000 MCG/ML IJ SOLN: 1000.0000 ug | Freq: Once | INTRAMUSCULAR | Status: DC

## 2012-05-09 MED ORDER — NITROGLYCERIN 0.4 MG SL SUBL: 0.4000 mg | SUBLINGUAL_TABLET | SUBLINGUAL | Status: DC | PRN

## 2012-05-09 NOTE — Patient Instructions (Addendum)
Mix Eucerin 1 part to Cort Aid 1 part and apply  Twice a day to itchy area of skin as needed . 

## 2012-05-09 NOTE — Progress Notes (Signed)
  Subjective:    Patient ID: James [name], male    DOB: Nov 02, 1934, 77 y.o.   MRN: [id number]  HPI    Review of Systems     Objective:   Physical Exam        Assessment & Plan:

## 2012-05-09 NOTE — Progress Notes (Signed)
  Subjective:    Patient ID: James [name], male    DOB: 1935-02-26, 77 y.o.   MRN: [id number]  HPI The fasting glucose has ranged and 75-120. He denies any symptoms sugars when are in the low 70s. He is unable to check sugars 2 hours after his meal due to the meal schedule at the [facility] facility. Approximately one & half hours after a meal; highest glucose was 170.  He has glucometer reveals a value approximately 16 points lower than ours;it is only a month old. The random sugar here was 79.  In July his A1c was 7.4%. Prior to that it had been > 8%.      Review of Systems He denies excessive thirst, hunger, urination. He's had no chest pain or palpitations. He states that the neuropathy symptoms have resolved. B12 shots were ordered weekly x4 then monthly thereafter.  He denies any ulcers but has had a rash over the left lower extremity which is itching     Objective:   Physical Exam  He appears well-nourished in no distress. The most striking physical finding is profound hearing loss.  He has a regular rhythm with flow murmur.  Breath sounds are decreased; he is no increased work of breathing  There is an eczematoid rash of the left lower leg  He is alert and oriented and interactive.        Assessment & Plan:  #1 diabetes with hypoglycemia. ? malfunctioning glucometer  #2 eczematoid type rash  Plan: See orders and recommendations. New glucometer will be provided

## 2012-05-12 NOTE — Assessment & Plan Note (Signed)
A1 C 6 .7%; he is having hypoglycemic episodes. Glimiperide will be decreased to 2 mg one half each morning. A1 C recommended in eight weeks.

## 2012-07-04 ENCOUNTER — Telehealth: Payer: Self-pay | Admitting: Internal Medicine

## 2012-07-04 NOTE — Telephone Encounter (Signed)
Refill- simvastatin 40mg  tablet. Take one tablet by mouth at bedtime. Dx: hyperlipidemia. Qty 30

## 2012-07-05 MED ORDER — SIMVASTATIN 40 MG PO TABS: 40.0000 mg | ORAL_TABLET | Freq: Every day | ORAL | Status: DC

## 2012-07-05 NOTE — Telephone Encounter (Signed)
RX sent

## 2012-07-08 ENCOUNTER — Telehealth: Payer: Self-pay | Admitting: Internal Medicine

## 2012-07-08 NOTE — Telephone Encounter (Signed)
Representative called and wants to know when paperwork will be ready- I show no record of paperwork, would you know?

## 2012-07-08 NOTE — Telephone Encounter (Signed)
Hopp please advise, I am not aware of any pending paperwork on this patient

## 2012-07-08 NOTE — Telephone Encounter (Signed)
I completed all paper work this weekend; they should reFax/mail the document as I have not seen any such concerning him.

## 2012-07-10 ENCOUNTER — Telehealth: Payer: Self-pay

## 2012-07-10 MED ORDER — AMBULATORY NON FORMULARY MEDICATION: Status: DC

## 2012-07-10 NOTE — Telephone Encounter (Signed)
Check glucose once daily if possible Fasting or morning glucose recommended M, W, F, & Sun if possible. Goal= 100-150 Glucose 2 hours after breakfast Tues, after lunch Thurs & 2 hrs after eve meal Sat if possible. Goal = < 180 

## 2012-07-10 NOTE — Telephone Encounter (Signed)
[name] from [facility] called requesting order for Blood sugar testing 1 x daily, they currently have an order for two times daily (order given in 2009). SInce patient is not insulin dependant he needs a new order stating to test blood sugar once daily.  Dr.Hopper please advise on DM testing

## 2012-07-10 NOTE — Telephone Encounter (Signed)
Order faxed to [phone]

## 2012-08-02 DIAGNOSIS — Z0279 Encounter for issue of other medical certificate: Secondary | ICD-10-CM

## 2012-09-18 ENCOUNTER — Telehealth: Payer: Self-pay | Admitting: Internal Medicine

## 2012-09-18 MED ORDER — ATENOLOL 50 MG PO TABS: 50.0000 mg | ORAL_TABLET | Freq: Every day | ORAL | Status: AC

## 2012-09-18 NOTE — Telephone Encounter (Signed)
Refill: Atenolol 50 mg tablet. Take 1 tab by mouth every day dx htn. Qty 30. Last fill 08-16-12

## 2012-09-18 NOTE — Telephone Encounter (Signed)
RX sent

## 2012-10-18 ENCOUNTER — Inpatient Hospital Stay (HOSPITAL_COMMUNITY)
Admission: EM | Admit: 2012-10-18 | Discharge: 2012-10-20 | DRG: 639 | Disposition: A | Discharge status: Home / Self Care | Payer: Medicare Other | Attending: Family Medicine | Admitting: Family Medicine

## 2012-10-18 ENCOUNTER — Emergency Department (HOSPITAL_COMMUNITY): Payer: Medicare Other

## 2012-10-18 ENCOUNTER — Encounter (HOSPITAL_COMMUNITY): Payer: Self-pay | Admitting: *Deleted

## 2012-10-18 DIAGNOSIS — F17200 Nicotine dependence, unspecified, uncomplicated: Secondary | ICD-10-CM | POA: Diagnosis present

## 2012-10-18 DIAGNOSIS — R4182 Altered mental status, unspecified: Secondary | ICD-10-CM

## 2012-10-18 DIAGNOSIS — Z951 Presence of aortocoronary bypass graft: Secondary | ICD-10-CM

## 2012-10-18 DIAGNOSIS — E039 Hypothyroidism, unspecified: Secondary | ICD-10-CM | POA: Diagnosis present

## 2012-10-18 DIAGNOSIS — I10 Essential (primary) hypertension: Secondary | ICD-10-CM | POA: Diagnosis present

## 2012-10-18 DIAGNOSIS — I639 Cerebral infarction, unspecified: Secondary | ICD-10-CM

## 2012-10-18 DIAGNOSIS — E785 Hyperlipidemia, unspecified: Secondary | ICD-10-CM | POA: Diagnosis present

## 2012-10-18 DIAGNOSIS — E1169 Type 2 diabetes mellitus with other specified complication: Principal | ICD-10-CM | POA: Diagnosis present

## 2012-10-18 DIAGNOSIS — I2510 Atherosclerotic heart disease of native coronary artery without angina pectoris: Secondary | ICD-10-CM | POA: Diagnosis present

## 2012-10-18 DIAGNOSIS — E162 Hypoglycemia, unspecified: Secondary | ICD-10-CM

## 2012-10-18 DIAGNOSIS — IMO0002 Reserved for concepts with insufficient information to code with codable children: Principal | ICD-10-CM | POA: Diagnosis present

## 2012-10-18 DIAGNOSIS — Z79899 Other long term (current) drug therapy: Secondary | ICD-10-CM

## 2012-10-18 LAB — CBC WITH DIFFERENTIAL/PLATELET
Basophils Relative: 0 % (ref 0–1)
Eosinophils Absolute: 0 10*3/uL (ref 0.0–0.7)
HCT: 38.6 % — ABNORMAL LOW (ref 39.0–52.0)
Hemoglobin: 13.1 g/dL (ref 13.0–17.0)
MCH: 29.2 pg (ref 26.0–34.0)
MCHC: 33.9 g/dL (ref 30.0–36.0)
MCV: 86.2 fL (ref 78.0–100.0)
Monocytes Absolute: 0.7 10*3/uL (ref 0.1–1.0)
Monocytes Relative: 12 % (ref 3–12)
Neutro Abs: 4.1 10*3/uL (ref 1.7–7.7)

## 2012-10-18 LAB — COMPREHENSIVE METABOLIC PANEL
Albumin: 3.9 g/dL (ref 3.5–5.2)
BUN: 30 mg/dL — ABNORMAL HIGH (ref 6–23)
Chloride: 100 mEq/L (ref 96–112)
Creatinine, Ser: 1.34 mg/dL (ref 0.50–1.35)
GFR calc non Af Amer: 50 mL/min — ABNORMAL LOW (ref >90)
Total Bilirubin: 0.4 mg/dL (ref 0.3–1.2)

## 2012-10-18 LAB — GLUCOSE, CAPILLARY: Glucose-Capillary: 231 mg/dL — ABNORMAL HIGH (ref 70–99)

## 2012-10-18 LAB — TROPONIN I: Troponin I: <0.3 ng/mL (ref <0.30)

## 2012-10-18 MED ORDER — DEXTROSE 50 % IV SOLN
50.0000 mL | Freq: Once | INTRAVENOUS | Status: AC
  Administered 2012-10-18: 50 mL via INTRAVENOUS
  Filled 2012-10-18: qty 50

## 2012-10-18 NOTE — ED Notes (Signed)
Pt given water and sandwich to eat.

## 2012-10-18 NOTE — ED Notes (Addendum)
Here by EMS, here from [facility] assisted living, arrives alert, NAD, calm, interactive, skin W&D, resps e/u, speaking in clear complete sentences. Usually walks and drives his car. Usually A&Ox4. Last known to be normal was Thursday night. Found to have a low cbg (40s) with confusion this am. tx'd this morning without improvement of confusion. cbg PTA for EMS was 89. EMS also reports: EKG NSR unremarkable, PERRL, no ptosis/droop, was ambulatory with steady gait, grips are equal and strong, pt unable to follow neuro scale commands completely. Pt has had a cough. LS with rhonchi. Pt answers to his name, but if asked what his name is, he searches for answer and will not get it right some of the time (intermitant), pt will also re-direct.

## 2012-10-18 NOTE — ED Notes (Signed)
Terri & Jamyson Jirak (son and daughter in law) would like to be updated about patients status/if admitted [phone]

## 2012-10-18 NOTE — ED Notes (Signed)
Pt states he feels a little SOB.

## 2012-10-18 NOTE — ED Provider Notes (Signed)
History     CSN: [id number]  Arrival date & time 10/18/12  1958   First MD Initiated Contact with Patient 10/18/12 2013      Chief Complaint  Patient presents with  . Altered Mental Status    (Consider location/radiation/quality/duration/timing/severity/associated sxs/prior treatment) HPI Comments: Patient from assisted living facility with altered mental status over the past day. Last normal last night. Have a low blood sugar this morning there is no improvement with treatment. Patient is normally oriented x4 and walks drives a car. Today he's only oriented to himself. He denies any pain complaints. He denies any nausea or vomiting. He endorses some shortness of breath and dry cough. Denies any leg pain or swelling. Denies any falls, dizziness or lightheadedness.  The history is provided by the patient and the EMS personnel. The history is limited by the condition of the patient.    Past Medical History  Diagnosis Date  . DM (diabetes mellitus)   . Other and unspecified hyperlipidemia   . HTN (hypertension)   . CAD (coronary artery disease)   . Unspecified hypothyroidism     Past Surgical History  Procedure Laterality Date  . Angioplasty  08/2006    Dr.Hochrein  . Colonoscopy w/ polypectomy  2005  . Coronary artery bypass graft  1993  . Tonsillectomy    . Foot surgery      Multiple surgeries for congenital deformities   . Cholecystectomy      Family History  Problem Relation Age of Onset  . Macular degeneration Father   . Coronary artery disease Father     CABG X 2  . Stroke Father   . Alzheimer's disease Mother   . Diabetes Brother     (+/-) "borderline  . Diabetes Maternal Aunt     X 2  . Diabetes Maternal Grandmother     History  Substance Use Topics  . Smoking status: Current Every Day Smoker -- 0.25 packs/day    Types: Cigarettes  . Smokeless tobacco: Not on file     Comment: Age 78 , smokes about 6 cigs/ daily   . Alcohol Use: Yes     Comment:  Occasionally       Review of Systems  Unable to perform ROS: Mental status change  Psychiatric/Behavioral: Positive for altered mental status.    Allergies  Review of patient's allergies indicates no known allergies.  Home Medications   No current outpatient prescriptions on file.  BP 143/68  Pulse 60  Temp(Src) 98.3 F (36.8 C) (Oral)  Resp 22  Wt 201 lb 6.4 oz (91.354 kg)  BMI 28.9 kg/m2  SpO2 94%  Physical Exam  Constitutional: He appears well-developed and well-nourished. No distress.  Mild tachypneic  HENT:  Head: Normocephalic and atraumatic.  Mouth/Throat: Oropharynx is clear and moist. No oropharyngeal exudate.  Eyes: Conjunctivae and EOM are normal. Pupils are equal, round, and reactive to light.  Neck: Normal range of motion. Neck supple.  Cardiovascular: Normal rate and normal heart sounds.   No murmur heard. Pulmonary/Chest: Effort normal and breath sounds normal. No respiratory distress.  Abdominal: Soft. There is no tenderness. There is no rebound and no guarding.  Musculoskeletal: Normal range of motion. He exhibits no edema and no tenderness.  Neurological: He is alert. No cranial nerve deficit. He exhibits normal muscle tone. Coordination normal.  CN 2-12 intact, 5/5 strength throughout, no pronator drift, no ataxia on finger to nose. Questionable R sided facial droop Some expressive aphasia  Skin: Skin  is warm.    ED Course  Procedures (including critical care time)  Labs Reviewed  CBC WITH DIFFERENTIAL - Abnormal; Notable for the following:    HCT 38.6 (*)    All other components within normal limits  COMPREHENSIVE METABOLIC PANEL - Abnormal; Notable for the following:    BUN 30 (*)    AST 38 (*)    GFR calc non Af Amer 50 (*)    GFR calc Af Amer 58 (*)    All other components within normal limits  URINALYSIS, ROUTINE W REFLEX MICROSCOPIC - Abnormal; Notable for the following:    APPearance CLOUDY (*)    Glucose, UA 250 (*)    Hgb urine  dipstick TRACE (*)    Bilirubin Urine SMALL (*)    Protein, ur 30 (*)    All other components within normal limits  GLUCOSE, CAPILLARY - Abnormal; Notable for the following:    Glucose-Capillary 244 (*)    All other components within normal limits  GLUCOSE, CAPILLARY - Abnormal; Notable for the following:    Glucose-Capillary 231 (*)    All other components within normal limits  URINE MICROSCOPIC-ADD ON - Abnormal; Notable for the following:    Bacteria, UA MANY (*)    Casts GRANULAR CAST (*)    Crystals URIC ACID CRYSTALS (*)    All other components within normal limits  GLUCOSE, CAPILLARY - Abnormal; Notable for the following:    Glucose-Capillary 157 (*)    All other components within normal limits  LIPID PANEL - Abnormal; Notable for the following:    HDL 35 (*)    All other components within normal limits  GLUCOSE, CAPILLARY - Abnormal; Notable for the following:    Glucose-Capillary 143 (*)    All other components within normal limits  GLUCOSE, CAPILLARY - Abnormal; Notable for the following:    Glucose-Capillary 152 (*)    All other components within normal limits  TROPONIN I  LACTIC ACID, PLASMA  AMMONIA  GLUCOSE, CAPILLARY  URINE RAPID DRUG SCREEN (HOSP PERFORMED)  HEMOGLOBIN A1C   Dg Chest 2 View  10/18/2012  *RADIOLOGY REPORT*  Clinical Data: Altered mental status.  Diabetic.  Hypertension.  CHEST - 2 VIEW  Comparison: 08/09/2010  Findings: Prior median sternotomy. Lateral view degraded by patient arm position.  Midline trachea.  Mild cardiomegaly. No pleural effusion or pneumothorax.  No congestive failure.  Low lung volumes with mild bibasilar atelectasis.  IMPRESSION: No acute cardiopulmonary disease.  Cardiomegaly with mild bibasilar atelectasis.   Original Report Authenticated By: Jeronimo Greaves, M.D.    Ct Head Wo Contrast  10/18/2012  *RADIOLOGY REPORT*  Clinical Data: Altered mental status  CT HEAD WITHOUT CONTRAST  Technique:  Contiguous axial images were obtained  from the base of the skull through the vertex without contrast.  Comparison: 12/11/2007  Findings: No evidence of parenchymal hemorrhage or extra-axial fluid collection. No mass lesion, mass effect, or midline shift.  No CT evidence of acute infarction.  Old left thalamic lacunar infarct (series 2/image 19).  Extensive subcortical Machamer matter and periventricular small vessel ischemic changes.  Intracranial atherosclerosis.  Small volume is age appropriate.  No ventriculomegaly.  Mild mucosal thickening in the bilateral maxillary and ethmoid sinuses.  The mastoid air cells are clear.  No evidence of calvarial fracture.  IMPRESSION: No evidence of acute intracranial abnormality.  Small vessel ischemic changes with intracranial atherosclerosis.   Original Report Authenticated By: Charline Bills, M.D.      1. Mental  status change   2. CVA (cerebral infarction)   3. HTN (hypertension)   4. Hypoglycemia   5. Type II or unspecified type diabetes mellitus without mention of complication, uncontrolled       MDM  Altered mental status x 1 day with reported hypoglycemia.  CBG 77 here.  Vitals stable, no distress.  Transient hypoglycemia has improved but deficits have not. Concern for CVA.  CT head negative.  Questionable facial droop.  Patient not tPA candidate 2/2 delayed presentation. Pursue infectious workup as well.  Will need admission for CVA rule out.    Date: 10/19/2012  Rate: 72  Rhythm: normal sinus rhythm  QRS Axis: normal  Intervals: normal  ST/T Wave abnormalities: normal  Conduction Disutrbances:none  Narrative Interpretation:   Old EKG Reviewed: unchanged       Glynn Octave, MD 10/19/12 1432

## 2012-10-19 ENCOUNTER — Encounter (HOSPITAL_COMMUNITY): Payer: Self-pay | Admitting: Internal Medicine

## 2012-10-19 ENCOUNTER — Inpatient Hospital Stay (HOSPITAL_COMMUNITY): Payer: Medicare Other

## 2012-10-19 DIAGNOSIS — I639 Cerebral infarction, unspecified: Secondary | ICD-10-CM | POA: Diagnosis present

## 2012-10-19 DIAGNOSIS — R4182 Altered mental status, unspecified: Secondary | ICD-10-CM

## 2012-10-19 DIAGNOSIS — I10 Essential (primary) hypertension: Secondary | ICD-10-CM

## 2012-10-19 DIAGNOSIS — E162 Hypoglycemia, unspecified: Secondary | ICD-10-CM | POA: Diagnosis present

## 2012-10-19 DIAGNOSIS — I6350 Cerebral infarction due to unspecified occlusion or stenosis of unspecified cerebral artery: Secondary | ICD-10-CM

## 2012-10-19 DIAGNOSIS — I517 Cardiomegaly: Secondary | ICD-10-CM

## 2012-10-19 LAB — URINE MICROSCOPIC-ADD ON

## 2012-10-19 LAB — URINALYSIS, ROUTINE W REFLEX MICROSCOPIC
Ketones, ur: NEGATIVE mg/dL
Leukocytes, UA: NEGATIVE
Nitrite: NEGATIVE
Protein, ur: 30 mg/dL — AB
Urobilinogen, UA: 0.2 mg/dL (ref 0.0–1.0)
pH: 5 (ref 5.0–8.0)

## 2012-10-19 LAB — LIPID PANEL
HDL: 35 mg/dL — ABNORMAL LOW (ref >39)
LDL Cholesterol: 69 mg/dL (ref 0–99)
VLDL: 20 mg/dL (ref 0–40)

## 2012-10-19 LAB — GLUCOSE, CAPILLARY
Glucose-Capillary: 157 mg/dL — ABNORMAL HIGH (ref 70–99)
Glucose-Capillary: 143 mg/dL — ABNORMAL HIGH (ref 70–99)
Glucose-Capillary: 152 mg/dL — ABNORMAL HIGH (ref 70–99)

## 2012-10-19 MED ORDER — ASPIRIN 325 MG PO TABS
325.0000 mg | ORAL_TABLET | Freq: Every day | ORAL | Status: DC
  Administered 2012-10-19 – 2012-10-20 (×2): 325 mg via ORAL
  Filled 2012-10-19 (×2): qty 1

## 2012-10-19 MED ORDER — BENAZEPRIL HCL 20 MG PO TABS
20.0000 mg | ORAL_TABLET | Freq: Every day | ORAL | Status: DC
  Administered 2012-10-19 – 2012-10-20 (×2): 20 mg via ORAL
  Filled 2012-10-19 (×2): qty 1

## 2012-10-19 MED ORDER — SODIUM CHLORIDE 0.9 % IJ SOLN: 3.0000 mL | INTRAMUSCULAR | Status: DC | PRN

## 2012-10-19 MED ORDER — METFORMIN HCL 500 MG PO TABS
1000.0000 mg | ORAL_TABLET | Freq: Two times a day (BID) | ORAL | Status: DC
  Administered 2012-10-19 – 2012-10-20 (×3): 1000 mg via ORAL
  Filled 2012-10-19 (×5): qty 2

## 2012-10-19 MED ORDER — GADOBENATE DIMEGLUMINE 529 MG/ML IV SOLN
20.0000 mL | Freq: Once | INTRAVENOUS | Status: AC | PRN
  Administered 2012-10-19: 20 mL via INTRAVENOUS

## 2012-10-19 MED ORDER — CYANOCOBALAMIN 1000 MCG/ML IJ SOLN: 1000.0000 ug | INTRAMUSCULAR | Status: DC

## 2012-10-19 MED ORDER — PANCRELIPASE (LIP-PROT-AMYL) 12000-38000 UNITS PO CPEP
1.0000 | ORAL_CAPSULE | Freq: Three times a day (TID) | ORAL | Status: DC
  Administered 2012-10-19 – 2012-10-20 (×5): 1 via ORAL
  Filled 2012-10-19 (×7): qty 1

## 2012-10-19 MED ORDER — CLINDAMYCIN PHOSPHATE 600 MG/50ML IV SOLN
600.0000 mg | Freq: Three times a day (TID) | INTRAVENOUS | Status: DC
  Administered 2012-10-19 (×2): 600 mg via INTRAVENOUS
  Filled 2012-10-19 (×4): qty 50

## 2012-10-19 MED ORDER — SODIUM CHLORIDE 0.9 % IJ SOLN
3.0000 mL | Freq: Two times a day (BID) | INTRAMUSCULAR | Status: DC
  Administered 2012-10-19 – 2012-10-20 (×4): 3 mL via INTRAVENOUS

## 2012-10-19 MED ORDER — SIMVASTATIN 40 MG PO TABS
40.0000 mg | ORAL_TABLET | Freq: Every day | ORAL | Status: DC
  Administered 2012-10-19: 40 mg via ORAL
  Filled 2012-10-19 (×2): qty 1

## 2012-10-19 MED ORDER — ATENOLOL 50 MG PO TABS
50.0000 mg | ORAL_TABLET | Freq: Every day | ORAL | Status: DC
  Administered 2012-10-19 – 2012-10-20 (×2): 50 mg via ORAL
  Filled 2012-10-19 (×2): qty 1

## 2012-10-19 MED ORDER — CLINDAMYCIN PHOSPHATE 600 MG/50ML IV SOLN
600.0000 mg | Freq: Three times a day (TID) | INTRAVENOUS | Status: DC
  Filled 2012-10-19 (×2): qty 50

## 2012-10-19 MED ORDER — SODIUM CHLORIDE 0.9 % IV SOLN: 250.0000 mL | INTRAVENOUS | Status: DC | PRN

## 2012-10-19 MED ORDER — ENOXAPARIN SODIUM 40 MG/0.4ML ~~LOC~~ SOLN
40.0000 mg | Freq: Every day | SUBCUTANEOUS | Status: DC
  Administered 2012-10-19 – 2012-10-20 (×2): 40 mg via SUBCUTANEOUS
  Filled 2012-10-19 (×2): qty 0.4

## 2012-10-19 MED ORDER — INSULIN ASPART 100 UNIT/ML ~~LOC~~ SOLN
0.0000 [IU] | Freq: Three times a day (TID) | SUBCUTANEOUS | Status: DC
  Administered 2012-10-19 – 2012-10-20 (×3): 2 [IU] via SUBCUTANEOUS

## 2012-10-19 MED ORDER — ALBUTEROL SULFATE HFA 108 (90 BASE) MCG/ACT IN AERS: 2.0000 | INHALATION_SPRAY | RESPIRATORY_TRACT | Status: DC | PRN

## 2012-10-19 MED ORDER — VITAMIN D3 25 MCG (1000 UNIT) PO TABS
1000.0000 [IU] | ORAL_TABLET | Freq: Every day | ORAL | Status: DC
  Administered 2012-10-19 – 2012-10-20 (×2): 1000 [IU] via ORAL
  Filled 2012-10-19 (×2): qty 1

## 2012-10-19 MED ORDER — INSULIN ASPART 100 UNIT/ML ~~LOC~~ SOLN: 0.0000 [IU] | Freq: Every day | SUBCUTANEOUS | Status: DC

## 2012-10-19 MED ORDER — LEVOTHYROXINE SODIUM 150 MCG PO TABS: 150.0000 ug | ORAL_TABLET | ORAL | Status: DC

## 2012-10-19 MED ORDER — ACETAMINOPHEN 325 MG PO TABS: 650.0000 mg | ORAL_TABLET | ORAL | Status: DC | PRN

## 2012-10-19 MED ORDER — LEVOTHYROXINE SODIUM 100 MCG PO TABS
100.0000 ug | ORAL_TABLET | ORAL | Status: DC
  Administered 2012-10-19 – 2012-10-20 (×2): 100 ug via ORAL
  Filled 2012-10-19 (×3): qty 1

## 2012-10-19 NOTE — Progress Notes (Signed)
PROGRESS NOTE  James [name] ZOX:[id number] DOB: 1934-10-17 DOA: 10/18/2012 PCP: Marga Melnick, MD  Brief narrative: 78 yr old male admitted 3.8.14 with AMS, Aphasia in a setting of diabetes and prior h/o CAD  Past medical history-As per Problem list Chart reviewed as below- Admission 4.24.09 for cholecystitis/hypothyroid Admission 1.19.07 with CAD-Had PCI and cutting balloon Angi-prior h/o CABG x 6 in [place] 1992  Consultants:  none  Procedures:  Ct head 3.7.14 IMPRESSION: No evidence of acute intracranial abnormality. Small vessel ischemic changes with intracranial atherosclerosis.  CXR 2 view cardiomegally, no acute process  Antibiotics:  Clindamycin 3.7.14   Subjective  Doing well.  Doesn't;t recall all of what occurred.  Now at seeming baseline Denies any cp/n/v/sob   Objective    Interim History: nad  Telemetry: NSR  Objective: Filed Vitals:   10/19/12 0600 10/19/12 0621 10/19/12 0800 10/19/12 1000  BP: 131/54  150/62 148/65  Pulse: 64  65 63  Temp:   98 F (36.7 C) 97.8 F (36.6 C)  TempSrc:      Resp: 22  20 18   Weight:  91.354 kg (201 lb 6.4 oz)    SpO2: 95%  94% 95%   No intake or output data in the 24 hours ending 10/19/12 1307  Exam:  General: alert Cardiovascular: s1 s2 no m/r/g Respiratory: clear, no added sound Abdomen: soft/NT/ND Skinnad Neurogorssly intact but HOH  Data Reviewed: Basic Metabolic Panel:  Recent Labs Lab 10/18/12 2047  NA 136  K 4.3  CL 100  CO2 24  GLUCOSE 80  BUN 30*  CREATININE 1.34  CALCIUM 9.2   Liver Function Tests:  Recent Labs Lab 10/18/12 2047  AST 38*  ALT 30  ALKPHOS 76  BILITOT 0.4  PROT 7.4  ALBUMIN 3.9   No results found for this basename: LIPASE, AMYLASE,  in the last 168 hours  Recent Labs Lab 10/18/12 2104  AMMONIA 27   CBC:  Recent Labs Lab 10/18/12 2047  WBC 5.6  NEUTROABS 4.1  HGB 13.1  HCT 38.6*  MCV 86.2  PLT 154   Cardiac Enzymes:  Recent Labs Lab  10/18/12 2103  TROPONINI <0.30   BNP: No components found with this basename: POCBNP,  CBG:  Recent Labs Lab 10/18/12 2300 10/18/12 2348 10/19/12 0158 10/19/12 0751 10/19/12 1145  GLUCAP 244* 231* 157* 143* 152*    No results found for this or any previous visit (from the past 240 hour(s)).   Studies:              All Imaging reviewed and is as per above notation   Scheduled Meds: . aspirin  325 mg Oral Daily  . atenolol  50 mg Oral Daily  . benazepril  20 mg Oral Daily  . cholecalciferol  1,000 Units Oral Daily  . clindamycin (CLEOCIN) IV  600 mg Intravenous Q8H  . [START ON 10/21/2012] cyanocobalamin  1,000 mcg Intramuscular Q30 days  . enoxaparin (LOVENOX) injection  40 mg Subcutaneous Daily  . insulin aspart  0-15 Units Subcutaneous TID WC  . insulin aspart  0-5 Units Subcutaneous QHS  . levothyroxine  100 mcg Oral Custom  . [START ON 10/23/2012] levothyroxine  150 mcg Oral Weekly  . lipase/protease/amylase  1 capsule Oral TID WC  . metFORMIN  1,000 mg Oral BID WC  . simvastatin  40 mg Oral QHS  . sodium chloride  3 mL Intravenous Q12H   Continuous Infusions:    Assessment/Plan: 1. Agree with POC as per Dr.  LE-with exception that patient has no acute finding on Echo/Carotid/MRI-this could have been a TIA but given significant hypoglycemia, would argue that was the likely cause.  Will d/c oral hypoglycemics, calculate insulin needs and reassess in am-A1c pending still so need to determine if can d/c on higher dose of Metformin as monotherapy vs. Introduction of insulin-ambualte in halls and reassess gait.  Will d/c Abx as there is no over evidences of Pulm infection  Code Status: Full Family Communication:  None at bedsde Disposition Plan: inpatient   Pleas Koch, MD  [facility] Pager [phone] 10/19/2012, 1:07 PM    LOS: 1 day

## 2012-10-19 NOTE — Progress Notes (Signed)
VASCULAR LAB PRELIMINARY  PRELIMINARY  PRELIMINARY  PRELIMINARY  Carotid duplex completed.    Preliminary report:  Bilateral:  No evidence of hemodynamically significant internal carotid artery stenosis.   Vertebral artery flow is antegrade.     SLAUGHTER, [place], RVS 10/19/2012, 10:48 AM

## 2012-10-19 NOTE — Progress Notes (Signed)
  Echocardiogram 2D Echocardiogram has been performed.  [name], James FRANCES 10/19/2012, 11:47 AM

## 2012-10-19 NOTE — H&P (Signed)
[facility] History and Physical  James [name] ZOX:[id number] DOB: April 02, 1935    PCP:   Marga Melnick, MD   Chief Complaint: Altered mental status and expressive aphasia.  HPI: James [name] is an 78 y.o. male with multiple medical problems including diabetes, frequent hypoglycemic reaction, hypertension, coronary disease under the care of Dr. [name], hypothyroidism, resident of an assisted living, brought in because he was a bit confused, having low blood glucose (37), and having trouble expressing himself. I was not able to obtain meaningful information except that he had altered mental status. I was able to speak with his sister-in-law who informed me that he has been highly functional. He still drives and has no trouble with his speech. Earlier today, he was found to have a blood glucose of 37, and he was transported to the emergency room. After dextrose given, she was able to speak with him and noted distinctively that he has expressive aphasia. This is new for him. His ictus was out of the window for thrombolytic therapy. He denied headache, slurred speech, focal weakness, fever, chills, or facial droop. Workup in emergency room included a CT of the head which was negative, his blood glucose now is 80, normal liver function tests, unremarkable renal function tests, and no leukocytosis. She also stated he has been coughing today after eating. There has been no history of aspiration. Hospitalist was asked to admit him for altered mental status.  Rewiew of Systems:  Constitutional: Negative for malaise, fever and chills. No significant weight loss or weight gain Eyes: Negative for eye pain, redness and discharge, diplopia, visual changes, or flashes of light. ENMT: Negative for ear pain, hoarseness, nasal congestion, sinus pressure and sore throat. No headaches; tinnitus, drooling, or problem swallowing. Cardiovascular: Negative for chest pain, palpitations, diaphoresis, dyspnea and  peripheral edema. ; No orthopnea, PND Respiratory: Negative for cough, hemoptysis, wheezing and stridor. No pleuritic chestpain. Gastrointestinal: Negative for nausea, vomiting, diarrhea, constipation, abdominal pain, melena, blood in stool, hematemesis, jaundice and rectal bleeding.    Genitourinary: Negative for frequency, dysuria, incontinence,flank pain and hematuria; Musculoskeletal: Negative for back pain and neck pain. Negative for swelling and trauma.;  Skin: . Negative for pruritus, rash, abrasions, bruising and skin lesion.; ulcerations Neuro: Negative for headache, lightheadedness and neck stiffness. Negative for weakness, altered level of consciousness , extremity weakness, burning feet, involuntary movement, seizure and syncope.  Psych: negative for anxiety, depression, insomnia, tearfulness, panic attacks, hallucinations, paranoia, suicidal or homicidal ideation    Past Medical History  Diagnosis Date  . DM (diabetes mellitus)   . Other and unspecified hyperlipidemia   . HTN (hypertension)   . CAD (coronary artery disease)   . Unspecified hypothyroidism     Past Surgical History  Procedure Laterality Date  . Angioplasty  08/2006    Dr.Hochrein  . Colonoscopy w/ polypectomy  2005  . Coronary artery bypass graft  1993  . Tonsillectomy    . Foot surgery      Multiple surgeries for congenital deformities   . Cholecystectomy      Medications:  HOME MEDS: Prior to Admission medications   Medication Sig Start Date End Date Taking? Authorizing Provider  acetaminophen (TYLENOL) 325 MG tablet Take 650 mg by mouth every 4 (four) hours as needed for pain.   Yes Historical Provider, MD  albuterol (PROVENTIL HFA;VENTOLIN HFA) 108 (90 BASE) MCG/ACT inhaler Inhale 2 puffs into the lungs every 4 (four) hours as needed for shortness of breath.   Yes Historical Provider, MD  amylase-lipase-protease (PANCREASE MT 4) 07-18-11 MU per capsule Take 1 capsule by mouth daily before  breakfast.    Yes Historical Provider, MD  atenolol (TENORMIN) 50 MG tablet Take 1 tablet (50 mg total) by mouth daily. 09/18/12  Yes Pecola Lawless, MD  benazepril (LOTENSIN) 20 MG tablet Take 20 mg by mouth daily.     Yes Historical Provider, MD  cholecalciferol (VITAMIN D) 1000 UNITS tablet Take 1,000 Units by mouth daily.   Yes Historical Provider, MD  cyanocobalamin (,VITAMIN B-12,) 1000 MCG/ML injection Inject 1,000 mcg into the muscle every 30 (thirty) days.   Yes Historical Provider, MD  glimepiride (AMARYL) 2 MG tablet Take 2 mg by mouth daily before breakfast.     Yes Historical Provider, MD  guaiFENesin-dextromethorphan (ROBITUSSIN DM) 100-10 MG/5ML syrup Take 5 mLs by mouth every 4 (four) hours as needed for cough.   Yes Historical Provider, MD  Hydrocortisone (CORTAID MAXIMUM STRENGTH EX) Apply 1 application topically 2 (two) times daily as needed (apply to left hand as needed for arthritis).   Yes Historical Provider, MD  levothyroxine (SYNTHROID, LEVOTHROID) 100 MCG tablet Take 100 mcg by mouth See admin instructions. Pt takes 100 mcg on mon, tue, thu, fri, sat, sun   Yes Historical Provider, MD  levothyroxine (SYNTHROID, LEVOTHROID) 150 MCG tablet Take 150 mcg by mouth once a week. On wed. All other days takes 100 mcg.   Yes Historical Provider, MD  metFORMIN (GLUCOPHAGE) 1000 MG tablet Take 1,000 mg by mouth 2 (two) times daily with a meal.     Yes Historical Provider, MD  metroNIDAZOLE (METROGEL) 1 % gel Apply 1 application topically daily. 12/20/11  Yes Pecola Lawless, MD  simvastatin (ZOCOR) 40 MG tablet Take 1 tablet (40 mg total) by mouth at bedtime. 07/05/12  Yes Pecola Lawless, MD     Allergies:  No Known Allergies  Social History:   reports that he has been smoking Cigarettes.  He has been smoking about 0.25 packs per day. He does not have any smokeless tobacco history on file. He reports that  drinks alcohol. He reports that he does not use illicit drugs.  Family  History: Family History  Problem Relation Age of Onset  . Macular degeneration Father   . Coronary artery disease Father     CABG X 2  . Stroke Father   . Alzheimer's disease Mother   . Diabetes Brother     (+/-) "borderline  . Diabetes Maternal Aunt     X 2  . Diabetes Maternal Grandmother      Physical Exam: Filed Vitals:   10/18/12 2315 10/19/12 0000 10/19/12 0045 10/19/12 0130  BP: 141/59 127/57 126/65 144/64  Pulse: 67 66 63 62  Temp:      TempSrc:      Resp: 35 28 31 30   SpO2: 93% 93% 93% 93%   Blood pressure 144/64, pulse 62, temperature 99 F (37.2 C), temperature source Oral, resp. rate 30, SpO2 93.00%.  GEN:  Pleasant  patient lying in the stretcher in no acute distress; cooperative with exam. PSYCH:  alert and oriented x4; does not appear anxious or depressed; affect is appropriate. HEENT: Mucous membranes pink and anicteric; PERRLA; EOM intact; no cervical lymphadenopathy nor thyromegaly or carotid bruit; no JVD; There were no stridor. Neck is very supple. Perhaps a slight right facial droop? Breasts:: Not examined CHEST WALL: No tenderness CHEST: Normal respiration, clear to auscultation bilaterally.  HEART: Regular rate and rhythm.  There  are no murmur, rub, or gallops.   BACK: No kyphosis or scoliosis; no CVA tenderness ABDOMEN: soft and non-tender; no masses, no organomegaly, normal abdominal bowel sounds; no pannus; no intertriginous candida. There is no rebound and no distention. Rectal Exam: Not done EXTREMITIES: No bone or joint deformity; age-appropriate arthropathy of the hands and knees; no edema; no ulcerations.  There is no calf tenderness. Genitalia: not examined PULSES: 2+ and symmetric SKIN: Normal hydration no rash or ulceration CNS: Cranial nerves 2-12 grossly intact no focal lateralizing neurologic deficit.  Speech is fluent; uvula elevated with phonation, facial symmetry and tongue midline. DTR are normal bilaterally, cerebella exam is  intact, barbinski is negative and strengths are equaled bilaterally.  No sensory loss.  He clearly has expressive aphasia.   Labs on Admission:  Basic Metabolic Panel:  Recent Labs Lab 10/18/12 2047  NA 136  K 4.3  CL 100  CO2 24  GLUCOSE 80  BUN 30*  CREATININE 1.34  CALCIUM 9.2   Liver Function Tests:  Recent Labs Lab 10/18/12 2047  AST 38*  ALT 30  ALKPHOS 76  BILITOT 0.4  PROT 7.4  ALBUMIN 3.9   No results found for this basename: LIPASE, AMYLASE,  in the last 168 hours  Recent Labs Lab 10/18/12 2104  AMMONIA 27   CBC:  Recent Labs Lab 10/18/12 2047  WBC 5.6  NEUTROABS 4.1  HGB 13.1  HCT 38.6*  MCV 86.2  PLT 154   Cardiac Enzymes:  Recent Labs Lab 10/18/12 2103  TROPONINI <0.30    CBG:  Recent Labs Lab 10/18/12 2019 10/18/12 2300 10/18/12 2348 10/19/12 0158  GLUCAP 77 244* 231* 157*     Radiological Exams on Admission: Dg Chest 2 View  10/18/2012  *RADIOLOGY REPORT*  Clinical Data: Altered mental status.  Diabetic.  Hypertension.  CHEST - 2 VIEW  Comparison: 08/09/2010  Findings: Prior median sternotomy. Lateral view degraded by patient arm position.  Midline trachea.  Mild cardiomegaly. No pleural effusion or pneumothorax.  No congestive failure.  Low lung volumes with mild bibasilar atelectasis.  IMPRESSION: No acute cardiopulmonary disease.  Cardiomegaly with mild bibasilar atelectasis.   Original Report Authenticated By: Jeronimo Greaves, M.D.    Ct Head Wo Contrast  10/18/2012  *RADIOLOGY REPORT*  Clinical Data: Altered mental status  CT HEAD WITHOUT CONTRAST  Technique:  Contiguous axial images were obtained from the base of the skull through the vertex without contrast.  Comparison: 12/11/2007  Findings: No evidence of parenchymal hemorrhage or extra-axial fluid collection. No mass lesion, mass effect, or midline shift.  No CT evidence of acute infarction.  Old left thalamic lacunar infarct (series 2/image 19).  Extensive subcortical Cecena  matter and periventricular small vessel ischemic changes.  Intracranial atherosclerosis.  Small volume is age appropriate.  No ventriculomegaly.  Mild mucosal thickening in the bilateral maxillary and ethmoid sinuses.  The mastoid air cells are clear.  No evidence of calvarial fracture.  IMPRESSION: No evidence of acute intracranial abnormality.  Small vessel ischemic changes with intracranial atherosclerosis.   Original Report Authenticated By: Charline Bills, M.D.     Assessment/Plan Present on Admission:  . CVA (cerebral infarction) . Hypoglycemia . HYPOTHYROIDISM . HYPERLIPIDEMIA NEC/NOS . DIABETES MELLITUS, TYPE II, UNCONTROLLED  PLAN: Given his history, I suspect that he had a CVA. Although prolonged hypoglycemia can certainly mimic this presentation, it should have resolved by now. We'll admit him for stroke workup to include MRI of the brain along with echo and Doppler  of the carotid. Because he has history of hypoglycemia, I will stop his Amaryl since it is such a long acting hypoglycemic agent. He has been coughing and I will start him on clindamycin IV for presumptive treatment of aspiration pneumonia. Will have speech evaluate and make recommendations as well. For his hypothyroidism I have continue his supplement and will check TSH. I tried to clarify his CODE STATUS, and at this time he is a full code. He is otherwise stable, full code, and will be admitted to telemetry under [facility]. Thank you for allowing me to partake in the care of this nice gentleman  Other plans as per orders.  Code Status: FULL Unk Lightning, MD. [facility] Pager [phone] 7pm to 7am.  10/19/2012, 2:12 AM

## 2012-10-20 LAB — COMPREHENSIVE METABOLIC PANEL
ALT: 36 U/L (ref 0–53)
AST: 34 U/L (ref 0–37)
Albumin: 3.6 g/dL (ref 3.5–5.2)
Alkaline Phosphatase: 85 U/L (ref 39–117)
BUN: 28 mg/dL — ABNORMAL HIGH (ref 6–23)
CO2: 22 mEq/L (ref 19–32)
Calcium: 9.2 mg/dL (ref 8.4–10.5)
Chloride: 103 mEq/L (ref 96–112)
Creatinine, Ser: 1.12 mg/dL (ref 0.50–1.35)
GFR calc Af Amer: 72 mL/min — ABNORMAL LOW (ref >90)
GFR calc non Af Amer: 62 mL/min — ABNORMAL LOW (ref >90)
Glucose, Bld: 204 mg/dL — ABNORMAL HIGH (ref 70–99)
Potassium: 4.4 mEq/L (ref 3.5–5.1)
Sodium: 139 mEq/L (ref 135–145)
Total Bilirubin: 0.3 mg/dL (ref 0.3–1.2)
Total Protein: 7 g/dL (ref 6.0–8.3)

## 2012-10-20 LAB — HEMOGLOBIN A1C: Hgb A1c MFr Bld: 8 % — ABNORMAL HIGH (ref <5.7)

## 2012-10-20 LAB — CBC
MCV: 86.1 fL (ref 78.0–100.0)
Platelets: 159 10*3/uL (ref 150–400)
RBC: 4.32 MIL/uL (ref 4.22–5.81)
WBC: 5 10*3/uL (ref 4.0–10.5)

## 2012-10-20 LAB — GLUCOSE, CAPILLARY: Glucose-Capillary: 113 mg/dL — ABNORMAL HIGH (ref 70–99)

## 2012-10-20 NOTE — Clinical Social Work Note (Signed)
Patient to return to [facility] today with family. Patient, family , and facility aware of transfer. Discharge packet complete. CSW signing off.  Ricke Hey, [place] [phone] (weekend)

## 2012-10-20 NOTE — Progress Notes (Signed)
Report given to Clydie Braun at [facility]. Reviewed discharge instructions- medications, diet, treatments received, last vital signs and all discharge instructions. Gave my name and number to Clydie Braun to call with any additional questions or concerns. Patient transported to facility with family. Packet with discharge summary and AVS given to patient family to transport to [facility].

## 2012-10-20 NOTE — Discharge Summary (Signed)
Physician Discharge Summary  James [name] ZOX:[id number] DOB: [birth year] DOA: 10/18/2012  PCP: Marga Melnick, MD  Admit date: 10/18/2012 Discharge date: 10/20/2012  Time spent: 25 minutes  Recommendations for Outpatient Follow-up:  1. Recommend reassessment of basic metabolic panel and complete blood count in one to 2 weeks 2. Would not continue sulfonylurea 3. Outpatient followup for diabetes mellitus  Discharge Diagnoses:  Principal Problem:   CVA (cerebral infarction) Active Problems:   HYPOTHYROIDISM   DIABETES MELLITUS, TYPE II, UNCONTROLLED   HYPERLIPIDEMIA NEC/NOS   Hypoglycemia   Discharge Condition: Good  Diet recommendation: Diabetic  Filed Weights   10/19/12 0621 10/20/12 0600  Weight: 91.354 kg (201 lb 6.4 oz) 92 kg (202 lb 13.2 oz)    History of present illness:  78 yr old male admitted 3.8.14 with AMS, Aphasia in a setting of diabetes and prior h/o CAD-he was found to have a blood sugar of 37 and trouble speaking. Admitting physician spoke with sister-in-law who informed them that he was more out of sorts and confused, which is unlike his usual very oriented alert self. He was admitted and worked up-ECHO and Carotids and MRI all were done which did not show any specific findings. His findings were most likely thought to be consistent with the Use of Amaryl He had an HbA1c performed which needs follow up   Procedures:  Echo  Carotid  MRI  Consultations:  none  Discharge Exam: Filed Vitals:   10/19/12 1800 10/19/12 2100 10/20/12 0600 10/20/12 1010  BP: 140/68 125/65 137/57 119/60  Pulse: 65 70 61 70  Temp: 98 F (36.7 C) 98.2 F (36.8 C) 97.8 F (36.6 C)   TempSrc:      Resp: 22 20 18    Weight:   92 kg (202 lb 13.2 oz)   SpO2: 97% 99% 96%    Alert pleasant oriented NAD, no slurred speech  General: oriented Cardiovascular:  s1 s2 no m/r/g Respiratory: clear , no addd sounds  Discharge Instructions  Discharge Orders   Future Orders  Complete By Expires     Diet - low sodium heart healthy  As directed     Increase activity slowly  As directed         Medication List    STOP taking these medications       glimepiride 2 MG tablet  Commonly known as:  AMARYL      TAKE these medications       acetaminophen 325 MG tablet  Commonly known as:  TYLENOL  Take 650 mg by mouth every 4 (four) hours as needed for pain.     albuterol 108 (90 BASE) MCG/ACT inhaler  Commonly known as:  PROVENTIL HFA;VENTOLIN HFA  Inhale 2 puffs into the lungs every 4 (four) hours as needed for shortness of breath.     amylase-lipase-protease 07-18-11 MU per capsule  Commonly known as:  PANCREASE MT 4  Take 1 capsule by mouth daily before breakfast.     atenolol 50 MG tablet  Commonly known as:  TENORMIN  Take 1 tablet (50 mg total) by mouth daily.     benazepril 20 MG tablet  Commonly known as:  LOTENSIN  Take 20 mg by mouth daily.     cholecalciferol 1000 UNITS tablet  Commonly known as:  VITAMIN D  Take 1,000 Units by mouth daily.     CORTAID MAXIMUM STRENGTH EX  Apply 1 application topically 2 (two) times daily as needed (apply to left hand as needed for  arthritis).     cyanocobalamin 1000 MCG/ML injection  Commonly known as:  (VITAMIN B-12)  Inject 1,000 mcg into the muscle every 30 (thirty) days.     guaiFENesin-dextromethorphan 100-10 MG/5ML syrup  Commonly known as:  ROBITUSSIN DM  Take 5 mLs by mouth every 4 (four) hours as needed for cough.     levothyroxine 150 MCG tablet  Commonly known as:  SYNTHROID, LEVOTHROID  Take 150 mcg by mouth once a week. On wed. All other days takes 100 mcg.     levothyroxine 100 MCG tablet  Commonly known as:  SYNTHROID, LEVOTHROID  Take 100 mcg by mouth See admin instructions. Pt takes 100 mcg on mon, tue, thu, fri, sat, sun     metFORMIN 1000 MG tablet  Commonly known as:  GLUCOPHAGE  Take 1,000 mg by mouth 2 (two) times daily with a meal.     metroNIDAZOLE 1 % gel  Commonly  known as:  METROGEL  Apply 1 application topically daily.     simvastatin 40 MG tablet  Commonly known as:  ZOCOR  Take 1 tablet (40 mg total) by mouth at bedtime.          The results of significant diagnostics from this hospitalization (including imaging, microbiology, ancillary and laboratory) are listed below for reference.    Significant Diagnostic Studies: Dg Chest 2 View  10/18/2012  *RADIOLOGY REPORT*  Clinical Data: Altered mental status.  Diabetic.  Hypertension.  CHEST - 2 VIEW  Comparison: 08/09/2010  Findings: Prior median sternotomy. Lateral view degraded by patient arm position.  Midline trachea.  Mild cardiomegaly. No pleural effusion or pneumothorax.  No congestive failure.  Low lung volumes with mild bibasilar atelectasis.  IMPRESSION: No acute cardiopulmonary disease.  Cardiomegaly with mild bibasilar atelectasis.   Original Report Authenticated By: Jeronimo Greaves, M.D.    Ct Head Wo Contrast  10/18/2012  *RADIOLOGY REPORT*  Clinical Data: Altered mental status  CT HEAD WITHOUT CONTRAST  Technique:  Contiguous axial images were obtained from the base of the skull through the vertex without contrast.  Comparison: 12/11/2007  Findings: No evidence of parenchymal hemorrhage or extra-axial fluid collection. No mass lesion, mass effect, or midline shift.  No CT evidence of acute infarction.  Old left thalamic lacunar infarct (series 2/image 19).  Extensive subcortical Wiegert matter and periventricular small vessel ischemic changes.  Intracranial atherosclerosis.  Small volume is age appropriate.  No ventriculomegaly.  Mild mucosal thickening in the bilateral maxillary and ethmoid sinuses.  The mastoid air cells are clear.  No evidence of calvarial fracture.  IMPRESSION: No evidence of acute intracranial abnormality.  Small vessel ischemic changes with intracranial atherosclerosis.   Original Report Authenticated By: Charline Bills, M.D.    Mr Laqueta Jean Wo Contrast  10/19/2012  *RADIOLOGY  REPORT*  Clinical Data: Altered mental status.  Expressive aphasia. Diabetes and hypertension  MRI HEAD WITHOUT AND WITH CONTRAST  Technique:  Multiplanar, multiecho pulse sequences of the brain and surrounding structures were obtained according to standard protocol without and with intravenous contrast  Contrast: 20mL MULTIHANCE GADOBENATE DIMEGLUMINE 529 MG/ML IV SOLN  Comparison: CT 10/18/2012  Findings: Negative for acute infarct.  Generalized atrophy. Moderate chronic microvascular ischemic changes in the [place] matter.  Chronic lacunar infarction in the left thalamus.  Benign cyst in the right basal ganglia.  Brainstem and cerebellum are intact.  Age appropriate atrophy.  Negative for hydrocephalus.  Negative for hemorrhage or mass lesion.  No pathologic enhancement following contrast administration.  Sinusitis  with air-fluid level right maxillary sinus.  IMPRESSION: Chronic microvascular ischemia.  No acute infarct.  Sinusitis with air-fluid level.   Original Report Authenticated By: Janeece Riggers, M.D.     Microbiology: No results found for this or any previous visit (from the past 240 hour(s)).   Labs: Basic Metabolic Panel:  Recent Labs Lab 10/18/12 2047 10/20/12 0830  NA 136 139  K 4.3 4.4  CL 100 103  CO2 24 22  GLUCOSE 80 204*  BUN 30* 28*  CREATININE 1.34 1.12  CALCIUM 9.2 9.2   Liver Function Tests:  Recent Labs Lab 10/18/12 2047 10/20/12 0830  AST 38* 34  ALT 30 36  ALKPHOS 76 85  BILITOT 0.4 0.3  PROT 7.4 7.0  ALBUMIN 3.9 3.6   No results found for this basename: LIPASE, AMYLASE,  in the last 168 hours  Recent Labs Lab 10/18/12 2104  AMMONIA 27   CBC:  Recent Labs Lab 10/18/12 2047 10/20/12 0830  WBC 5.6 5.0  NEUTROABS 4.1  --   HGB 13.1 12.6*  HCT 38.6* 37.2*  MCV 86.2 86.1  PLT 154 159   Cardiac Enzymes:  Recent Labs Lab 10/18/12 2103  TROPONINI <0.30   BNP: BNP (last 3 results) No results found for this basename: PROBNP,  in the last  8760 hours CBG:  Recent Labs Lab 10/19/12 0751 10/19/12 1145 10/19/12 1729 10/19/12 2118 10/20/12 0656  GLUCAP 143* 152* 114* 128* 113*       Signed:  Rhetta Mura  [facility] 10/20/2012, 12:21 PM

## 2012-11-08 ENCOUNTER — Ambulatory Visit (INDEPENDENT_AMBULATORY_CARE_PROVIDER_SITE_OTHER): Payer: Medicare Other | Admitting: *Deleted

## 2012-11-08 DIAGNOSIS — E538 Deficiency of other specified B group vitamins: Secondary | ICD-10-CM

## 2012-11-08 MED ORDER — CYANOCOBALAMIN 1000 MCG/ML IJ SOLN
1000.0000 ug | Freq: Once | INTRAMUSCULAR | Status: AC
  Administered 2012-11-08: 1000 ug via INTRAMUSCULAR

## 2012-12-02 ENCOUNTER — Telehealth: Payer: Self-pay | Admitting: Internal Medicine

## 2012-12-02 NOTE — Telephone Encounter (Signed)
Noted patient to be seen tomorrow

## 2012-12-02 NOTE — Telephone Encounter (Signed)
Patient Information:  Caller Name: Lupita Leash  Phone: [phone]  Patient: James [name], James [name]  Gender: Male  DOB: 02-Jun-1935  Age: 78 Years  PCP: Marga Melnick  Office Follow Up:  Does the office need to follow up with this patient?: No  Instructions For The Office: N/A   Symptoms  Reason For Call & Symptoms: Pt's family calling to ask that pt get an appt to be seen due to the fact medications were changed when pt was hospitalized and pt is not eating well  or regularly.  Family would like Dr Alwyn Ren to evaluate.  Family also feels the assisted living facility is not sharing things with Dr Alwyn Ren.  Reviewed Health History In EMR: Yes  Reviewed Medications In EMR: Yes  Reviewed Allergies In EMR: Yes  Reviewed Surgeries / Procedures: Yes  Date of Onset of Symptoms: 10/14/2012  Guideline(s) Used:  Diabetes - High Blood Sugar  Disposition Per Guideline:   See Today in Office  Reason For Disposition Reached:   Patient wants to be seen  Advice Given:  N/A  Patient Will Follow Care Advice:  YES  Appointment Scheduled:  12/03/2012 13:00:00 Appointment Scheduled Provider:  Marga Melnick

## 2012-12-03 ENCOUNTER — Encounter: Payer: Self-pay | Admitting: Internal Medicine

## 2012-12-03 ENCOUNTER — Ambulatory Visit (INDEPENDENT_AMBULATORY_CARE_PROVIDER_SITE_OTHER): Payer: Medicare Other | Admitting: Internal Medicine

## 2012-12-03 VITALS — BP 138/80 | HR 75 | Temp 97.8°F | Wt 208.0 lb

## 2012-12-03 DIAGNOSIS — E162 Hypoglycemia, unspecified: Secondary | ICD-10-CM

## 2012-12-03 MED ORDER — INSULIN DETEMIR 100 UNIT/ML ~~LOC~~ SOLN: 12.0000 [IU] | Freq: Every day | SUBCUTANEOUS | Status: DC

## 2012-12-03 NOTE — Assessment & Plan Note (Addendum)
He should avoid Sulfonylureas & any  oral agents which might induce hypoglycemia.

## 2012-12-03 NOTE — Progress Notes (Signed)
Subjective:    Patient ID: James [name], male    DOB: 1934/11/19, 77 y.o.   MRN: [id number]  HPI Diabetes status assessment: Fasting or morning glucose range 150-190. Glucose 2 hours after eve meal is < 350 Hypoglycemia 10/19/12 prompted admission & discontinuation of Glimiperide. He exhibited altered mental status and aphasia with a blood sugar of 37. There was significant improvement with IV dextrose. CT scan of the brain and MRI revealed only microvascular ischemic changes without infarct or bleed.  The plan at discharge from the hospital was to discontinue the sulfonylurea and continue insulin. He is not on insulin at the nursing facility.                                                                                                    No regular exercise described except weight lifting (dumbbells). No specific nutrition/diet followed Medication compliance is good; meds dispensed by nursing staff. No medication adverse effects noted since discharge. Eye exam current. Foot care  Current, seen every 2 mos  A1c 10/19/12 : 8% (average sugar 183 with long-term  60%  risk )     Review of Systems No excess thirst ;  excess hunger ; or excess urination reported                              Some lightheadedness with standing & with neck extension reported No chest pain ; palpitations ; claudication described .                                                                                                                             No non healing skin  ulcers or sores of extremities noted. No numbness or tingling  in feet described  . Burning  @ L medial ankle X 4-5 hrs intermittently  No significant change in weight ;2 pound gain . No blurred,double, or loss of vision reported  .          Objective:   Physical Exam Gen.:  Adequately nourished, appropriate  and alert, weight Eyes: No lid/conjunctival changes, extraocular motion intact Ears: decreased auditory acuity; aid worn on R Neck: Normal range of motion, thyroid normal Respiratory: No increased work of breathing or abnormal breath sounds Cardiac : regular rhythm, no extra heart sounds,or  gallop . Grade 1/6 systolic murmur Abdomen: No organomegaly ,masses, bruits or aortic enlargement Lymph: No lymphadenopathy of the neck or axilla Skin: No rashes, lesions, ulcers or ischemic changes Muscle skeletal: no nail changes;no finger joint changes Vasc:All pulses intact, no bruits present Neuro: Decreased knee deep tendon reflexes, alert & oriented. Tremor of hands Psych: judgment and insight, mood and affect normal        Assessment & Plan:

## 2012-12-03 NOTE — Assessment & Plan Note (Addendum)
A1c on 10/19/12 was at the minimal goal of 8%. Low-dose insulin as recommended with slow titration as needed to keep  fasting blood glucose is 150-180.  He describes some postural hypotension symptoms. Isometrics would be recommended prior to standing  He also describes some lightheadedness with neck hyperextension. 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 He should avoid this maneuver

## 2012-12-03 NOTE — Patient Instructions (Addendum)
Repeat the isometric exercises discussed 4- 5 times prior to standing if you've been seated for a period of time. Verify the average fasting blood sugar each week based on 7 daily measurements. Increase the daily dose of insulin by 2 units each day every Monday if the fasting blood sugars are  averaging over 200. Continue this titration by 5 units daily each week  up to a max of 35 units. At that time an A1c will be rechecked to determine optimal therapy.  The oral sulfonylurea agent, Glimiperide, must be discontinued   Eat a low-fat diet with lots of fruits and vegetables, up to 7-9 servings per day. Consume less than 40  Grams (preferably ZERO) of sugar per day from foods & drinks with High Fructose Corn Syrup (HFCS) sugar as #1,2,3 or # 4 on label.Whole Foods, [employer] & [employer] do not carry products with HFCS. Follow a  low carb nutrition program such as [place] or [employer]  to prevent Diabetes progression . [place] carbohydrates (potatoes, rice, bread, and pasta) have a high spike of sugar and a high load of sugar. For example a  baked potato has a cup of sugar and a  french fry  2 teaspoons of sugar. Yams, wild  rice, whole grained bread &  wheat pasta have been much lower spike and load of  sugar. Portions should be the size of a deck of cards or your palm.

## 2012-12-04 ENCOUNTER — Telehealth: Payer: Self-pay

## 2012-12-04 NOTE — Telephone Encounter (Signed)
Left message on voicemail informing patient of the importance that we receive an updated medication list from the facility that he lives in. Patient was here in the office yesterday and did not know what he was taking and stated that we should receive fax and as of right now we have not received. I left fax number for side B for patient to have medication list fax. Patient advised to call if questions or concerns

## 2012-12-09 ENCOUNTER — Telehealth: Payer: Self-pay | Admitting: *Deleted

## 2012-12-09 NOTE — Telephone Encounter (Signed)
Called and spoke with Grover Canavan who states that she will update provider.

## 2012-12-09 NOTE — Telephone Encounter (Signed)
Message copied by Verdene Rio on Mon Dec 09, 2012 11:17 AM ------      Message from: Pecola Lawless      Created: Sun Dec 08, 2012  6:15 PM       Please ask Joselyn Arrow to correct MD name from DAVID Hopper to [facility]. Thanks, Hopp ------

## 2012-12-16 ENCOUNTER — Encounter: Payer: Self-pay | Admitting: Internal Medicine

## 2012-12-16 ENCOUNTER — Telehealth: Payer: Self-pay | Admitting: General Practice

## 2012-12-16 DIAGNOSIS — E039 Hypothyroidism, unspecified: Secondary | ICD-10-CM

## 2012-12-16 NOTE — Telephone Encounter (Signed)
Called [facility] to have them fax over current med list.

## 2012-12-16 NOTE — Telephone Encounter (Signed)
This is a succinct, well researched note which delineates diagnosis and followup excellently.  Refill #30; schedule TSH. Code:244.9

## 2012-12-16 NOTE — Telephone Encounter (Signed)
Levothyroxine refill request. Pt last seen on 12/03/12 med listed as a historical med. TSH last completed on 09/05/11. Pt med list updated today. Received a fax from [facility] on 12/16/12. Please advise.

## 2012-12-17 MED ORDER — LEVOTHYROXINE SODIUM 100 MCG PO TABS: 100.0000 ug | ORAL_TABLET | ORAL | Status: DC

## 2012-12-17 NOTE — Telephone Encounter (Signed)
Med filled, labs place, pt has a lab appt for Monday 12/23/12.

## 2012-12-23 ENCOUNTER — Other Ambulatory Visit (INDEPENDENT_AMBULATORY_CARE_PROVIDER_SITE_OTHER): Payer: Medicare Other

## 2012-12-23 DIAGNOSIS — E039 Hypothyroidism, unspecified: Secondary | ICD-10-CM

## 2012-12-23 LAB — TSH: TSH: 0.88 u[IU]/mL (ref 0.35–5.50)

## 2012-12-23 NOTE — Progress Notes (Signed)
LABS ONLY  

## 2013-01-16 ENCOUNTER — Other Ambulatory Visit: Payer: Self-pay | Admitting: *Deleted

## 2013-01-16 NOTE — Telephone Encounter (Signed)
Fax from pharmacy states levothyroxine 150 mg but I do not see any refill history on this med. Per records the 100 mg was filled last on 12-17-12 Called Pt and left VM to verify what dose of thyroid med he is current taking. Will send in Rx once this info is obtain.

## 2013-01-17 MED ORDER — LEVOTHYROXINE SODIUM 150 MCG PO TABS: 150.0000 ug | ORAL_TABLET | ORAL | Status: DC

## 2013-01-17 NOTE — Telephone Encounter (Signed)
Pt called back and states that he takes both doses. Rx sent Pt aware

## 2013-02-05 ENCOUNTER — Telehealth: Payer: Self-pay | Admitting: *Deleted

## 2013-02-05 DIAGNOSIS — T887XXA Unspecified adverse effect of drug or medicament, initial encounter: Secondary | ICD-10-CM

## 2013-02-05 MED ORDER — METFORMIN HCL 1000 MG PO TABS: 1000.0000 mg | ORAL_TABLET | Freq: Two times a day (BID) | ORAL | Status: DC

## 2013-02-05 NOTE — Telephone Encounter (Signed)
On Med list as 1000 mg bid with meds;OK X 3 months but schedule fasting BMET & A1c in next 2 weeks .Code: 250.02;995.20

## 2013-02-05 NOTE — Telephone Encounter (Signed)
Last OV 12-03-12, no refill history.Please advise

## 2013-02-05 NOTE — Telephone Encounter (Signed)
Rx sent 

## 2013-02-05 NOTE — Telephone Encounter (Signed)
Pt scheduled for labs

## 2013-02-18 ENCOUNTER — Other Ambulatory Visit (INDEPENDENT_AMBULATORY_CARE_PROVIDER_SITE_OTHER): Payer: Medicare Other

## 2013-02-18 DIAGNOSIS — T887XXA Unspecified adverse effect of drug or medicament, initial encounter: Secondary | ICD-10-CM

## 2013-02-18 LAB — BASIC METABOLIC PANEL
CO2: 17 mEq/L — ABNORMAL LOW (ref 19–32)
Calcium: 9.3 mg/dL (ref 8.4–10.5)
GFR: 58.06 mL/min — ABNORMAL LOW (ref >60.00)
Sodium: 138 mEq/L (ref 135–145)

## 2013-02-19 ENCOUNTER — Encounter: Payer: Self-pay | Admitting: *Deleted

## 2013-02-26 ENCOUNTER — Other Ambulatory Visit: Payer: Self-pay | Admitting: *Deleted

## 2013-02-26 DIAGNOSIS — E7800 Pure hypercholesterolemia, unspecified: Secondary | ICD-10-CM

## 2013-02-26 MED ORDER — SIMVASTATIN 40 MG PO TABS: 40.0000 mg | ORAL_TABLET | Freq: Every day | ORAL | Status: DC

## 2013-02-26 NOTE — Telephone Encounter (Signed)
Rx refilled simvastatin 02/26/13

## 2013-03-18 ENCOUNTER — Telehealth: Payer: Self-pay

## 2013-03-18 ENCOUNTER — Encounter: Payer: Self-pay | Admitting: Internal Medicine

## 2013-03-18 ENCOUNTER — Ambulatory Visit (INDEPENDENT_AMBULATORY_CARE_PROVIDER_SITE_OTHER): Payer: Medicare Other | Admitting: Internal Medicine

## 2013-03-18 VITALS — BP 136/80 | HR 68 | Temp 97.6°F | Wt 208.0 lb

## 2013-03-18 DIAGNOSIS — E039 Hypothyroidism, unspecified: Secondary | ICD-10-CM

## 2013-03-18 DIAGNOSIS — IMO0001 Reserved for inherently not codable concepts without codable children: Secondary | ICD-10-CM

## 2013-03-18 LAB — HEMOGLOBIN A1C: Hgb A1c MFr Bld: 10.1 % — ABNORMAL HIGH (ref 4.6–6.5)

## 2013-03-18 MED ORDER — LEVOTHYROXINE SODIUM 100 MCG PO TABS: 100.0000 ug | ORAL_TABLET | Freq: Every day | ORAL | Status: DC

## 2013-03-18 MED ORDER — INSULIN DETEMIR 100 UNIT/ML ~~LOC~~ SOLN: SUBCUTANEOUS | Status: DC

## 2013-03-18 NOTE — Progress Notes (Signed)
Subjective:    Patient ID: James [name], male    DOB: March 11, 1935, 78 y.o.   MRN: [id number]  HPI  Diabetes & hypothyroidism  status assessment: Fasting or morning glucose range 103-170 Highest glucose 2 hours after any meal is , 230 No hypoglycemia reported                                                                                                                 Exercise described as weight lifting daily as 10 reps of 17#. No specific nutrition/diet followed but "cutting back on desserts" Medication compliance is good. No medication adverse effects noted. Eye exam current. Foot care not current  A1c/ urine microalbumin monitor  8% on 10/19/12  (average sugar  183 with long-term 60 %  risk ). He was given a titration schedule for insulin but this is not been implemented; he remains on 12 units of long acting, basal insulin. Recent random glucose was 317 on 02/18/13. At that time creatinine was 1.3; BUN 27; and GFR 58.06. Potassium was elevated at 5.3. His most recent TSH was therapeutic at 0.88 on thyroid supplement 100 mcg daily except for 1&1/2 on Wednesdays.    Review of Systems No excess thirst ;  excess hunger ; or excess urination reported                              No lightheadedness with standing reported No chest pain ; palpitations ; claudication described .                                                                                                                             No non healing skin  ulcers or sores of extremities noted. No numbness or tingling or burning in feet described  No significant change in weight . No blurred,double, or loss of vision reported  .            Objective:   Physical Exam  Gen.: Healthy and well-nourished in appearance. Alert, appropriate and cooperative throughout exam. Eyes: No corneal or  conjunctival inflammation noted. No icterus Ears:  Hearing aids bilaterally. Nose: External nasal exam reveals no deformity or inflammation. Nasal mucosa are pink and moist. No lesions or exudates noted. Hyponasal speech  Mouth: Oral mucosa and oropharynx reveal no lesions or exudates. Upper plate; lower partial Neck: No deformities, masses, or tenderness noted. Thyroid small Lungs: Normal respiratory effort; chest expands symmetrically. Lungs are clear to auscultation without rales, wheezes, or increased work of breathing. Heart: Normal rate and rhythm. Normal S1 and S2. No gallop, click, or rub. Grade 1/6 systolic murmur Abdomen: Bowel sounds normal; abdomen soft and nontender. No masses, organomegaly or hernias noted.                           Musculoskeletal/extremities: No clubbing or cyanosis. 1/2+ pitting  Edema. Diabetic shoes for  significant feet deformities. Able to lie down & sit up w/o help. Negative SLR bilaterally Vascular: Carotid, radial artery  pulsesare full and equal. Decreased dorsalis pedis and  posterior tibial pulses. Neurologic: Alert and oriented x3. Deep tendon reflexes symmetrical but 0+ @knees .     Skin: Intact without suspicious lesions or rashes. Lymph: No cervical, axillary lymphadenopathy present. Psych: Mood and affect are normal. Normally interactive                                                                                        Assessment & Plan:  See Current Assessment & Plan in Problem List under specific Diagnosis

## 2013-03-18 NOTE — Telephone Encounter (Signed)
His diabetes will require close monitor; I recommend that he be seen by Dr. Alwyn Ren group at this facility. There appears to be a dramatic discrepancy between his glucose readings and the random glucose of > 300 recorded here.

## 2013-03-18 NOTE — Patient Instructions (Addendum)
Average your fasting sugars each week; increase the daily insulin dose by 2 units each day each week until the average fasting blood sugar is 140-160. At that time stop increasing the dose & repeat the A1c and urine microalbumin. Please  schedule fasting Labs in 12 weeks after insulin & thyroid dose changes changes: BMET, TSH, A1c. Codes: [phone]

## 2013-03-18 NOTE — Assessment & Plan Note (Signed)
TSH 0.88 ; thyroid decreased to 100 mcg daily. TSH in 12 weeks

## 2013-03-18 NOTE — Assessment & Plan Note (Addendum)
Recheck A1c. Average your fasting sugars each week; increase the daily insulin dose by 2 units each day each week until the average fasting blood sugar is 140-160. At that time stop increasing the dose & repeat the A1c.Ex Increase to 14 units this week & 16 units next week if FBS average > 160. Recheck BMET in 12 weeks as on Metformin

## 2013-03-18 NOTE — Telephone Encounter (Signed)
[name] called to inform Dr.Hopper that they will be unable to follow instructions as listed in OV notes from today, the Med Techs located at this facility are only able to administer medications. They are not able to add patient's average blood sugars and increase medication based on averages, without a direct order from the doctor. I asked if there was a RN present at this facility and was told yes, She is unable to help because patient is in the assisted living part of facility and they do not allow the RN's to get involved. [name] indicated they will be able to take blood sugars and fax the results to [place] and we can average the blood sugar and then send new order for increased dose of medication if needed.   Per [name] please call and inform if doctor is ok with this process, if unavailable ok to leave message on voicemail   Hopp please advise

## 2013-03-18 NOTE — Telephone Encounter (Signed)
I called [name] and discussed Dr.Hopper's recommendations, [name] verbalized understanding. I also spoke with James [name] and informed him of Dr.Hopper's recommendation, patient verbalized understanding

## 2013-06-03 ENCOUNTER — Other Ambulatory Visit: Payer: Self-pay | Admitting: Family Medicine

## 2013-06-05 ENCOUNTER — Ambulatory Visit
Admission: RE | Admit: 2013-06-05 | Discharge: 2013-06-05 | Disposition: A | Discharge status: Home / Self Care | Payer: Medicare Other | Source: Ambulatory Visit | Attending: Family Medicine | Admitting: Family Medicine

## 2013-11-13 ENCOUNTER — Encounter (HOSPITAL_COMMUNITY): Payer: Self-pay | Admitting: Emergency Medicine

## 2013-11-13 ENCOUNTER — Emergency Department (HOSPITAL_COMMUNITY)
Admission: EM | Admit: 2013-11-13 | Discharge: 2013-11-13 | Disposition: A | Discharge status: Skilled Nursing Facility | Payer: Medicare Other | Attending: Emergency Medicine | Admitting: Emergency Medicine

## 2013-11-13 ENCOUNTER — Other Ambulatory Visit: Payer: Self-pay

## 2013-11-13 ENCOUNTER — Emergency Department (HOSPITAL_COMMUNITY): Payer: Medicare Other

## 2013-11-13 ENCOUNTER — Encounter (HOSPITAL_COMMUNITY)
Admission: EM | Disposition: A | Discharge status: Skilled Nursing Facility | Payer: Medicare Other | Source: Home / Self Care | Attending: Emergency Medicine

## 2013-11-13 ENCOUNTER — Encounter (HOSPITAL_COMMUNITY)
Admission: EM | Disposition: A | Discharge status: Skilled Nursing Facility | Payer: Self-pay | Source: Home / Self Care | Attending: Emergency Medicine

## 2013-11-13 DIAGNOSIS — E785 Hyperlipidemia, unspecified: Secondary | ICD-10-CM | POA: Insufficient documentation

## 2013-11-13 DIAGNOSIS — IMO0002 Reserved for concepts with insufficient information to code with codable children: Secondary | ICD-10-CM | POA: Insufficient documentation

## 2013-11-13 DIAGNOSIS — T18108A Unspecified foreign body in esophagus causing other injury, initial encounter: Secondary | ICD-10-CM | POA: Insufficient documentation

## 2013-11-13 DIAGNOSIS — E039 Hypothyroidism, unspecified: Secondary | ICD-10-CM | POA: Insufficient documentation

## 2013-11-13 DIAGNOSIS — K222 Esophageal obstruction: Secondary | ICD-10-CM

## 2013-11-13 DIAGNOSIS — I10 Essential (primary) hypertension: Secondary | ICD-10-CM | POA: Insufficient documentation

## 2013-11-13 DIAGNOSIS — T18128A Food in esophagus causing other injury, initial encounter: Secondary | ICD-10-CM | POA: Diagnosis present

## 2013-11-13 DIAGNOSIS — I2510 Atherosclerotic heart disease of native coronary artery without angina pectoris: Secondary | ICD-10-CM | POA: Insufficient documentation

## 2013-11-13 DIAGNOSIS — E119 Type 2 diabetes mellitus without complications: Secondary | ICD-10-CM | POA: Insufficient documentation

## 2013-11-13 DIAGNOSIS — W44F3XA Food entering into or through a natural orifice, initial encounter: Secondary | ICD-10-CM

## 2013-11-13 DIAGNOSIS — F17200 Nicotine dependence, unspecified, uncomplicated: Secondary | ICD-10-CM | POA: Insufficient documentation

## 2013-11-13 HISTORY — DX: Food in esophagus causing other injury, initial encounter: T18.128A

## 2013-11-13 HISTORY — PX: ESOPHAGOGASTRODUODENOSCOPY: SHX5428

## 2013-11-13 HISTORY — DX: Food entering into or through a natural orifice, initial encounter: W44.F3XA

## 2013-11-13 LAB — CBC
HEMATOCRIT: 37.2 % — AB (ref 39.0–52.0)
HEMOGLOBIN: 12.2 g/dL — AB (ref 13.0–17.0)
MCH: 28.8 pg (ref 26.0–34.0)
MCHC: 32.8 g/dL (ref 30.0–36.0)
MCV: 87.9 fL (ref 78.0–100.0)
Platelets: 166 10*3/uL (ref 150–400)
RBC: 4.23 MIL/uL (ref 4.22–5.81)
RDW: 14.7 % (ref 11.5–15.5)
WBC: 6.5 10*3/uL (ref 4.0–10.5)

## 2013-11-13 LAB — I-STAT CHEM 8, ED
BUN: 23 mg/dL (ref 6–23)
CALCIUM ION: 1.19 mmol/L (ref 1.13–1.30)
CREATININE: 1.4 mg/dL — AB (ref 0.50–1.35)
Chloride: 107 mEq/L (ref 96–112)
GLUCOSE: 251 mg/dL — AB (ref 70–99)
HEMATOCRIT: 39 % (ref 39.0–52.0)
HEMOGLOBIN: 13.3 g/dL (ref 13.0–17.0)
Potassium: 4.5 mEq/L (ref 3.7–5.3)
Sodium: 141 mEq/L (ref 137–147)
TCO2: 24 mmol/L (ref 0–100)

## 2013-11-13 LAB — GLUCOSE, CAPILLARY: GLUCOSE-CAPILLARY: 190 mg/dL — AB (ref 70–99)

## 2013-11-13 SURGERY — EGD (ESOPHAGOGASTRODUODENOSCOPY)
Anesthesia: Moderate Sedation

## 2013-11-13 MED ORDER — GLUCAGON HCL (RDNA) 1 MG IJ SOLR
1.0000 mg | Freq: Once | INTRAMUSCULAR | Status: AC
  Administered 2013-11-13: 1 mg via INTRAVENOUS
  Filled 2013-11-13: qty 1

## 2013-11-13 MED ORDER — MIDAZOLAM HCL 10 MG/2ML IJ SOLN
INTRAMUSCULAR | Status: DC | PRN
  Administered 2013-11-13: 2 mg via INTRAVENOUS
  Administered 2013-11-13: 1 mg via INTRAVENOUS
  Administered 2013-11-13: 2 mg via INTRAVENOUS

## 2013-11-13 MED ORDER — SODIUM CHLORIDE 0.9 % IV SOLN: INTRAVENOUS | Status: DC

## 2013-11-13 MED ORDER — ONDANSETRON HCL 4 MG/2ML IJ SOLN
4.0000 mg | Freq: Once | INTRAMUSCULAR | Status: AC
  Administered 2013-11-13: 4 mg via INTRAVENOUS
  Filled 2013-11-13: qty 2

## 2013-11-13 MED ORDER — STERILE WATER FOR INJECTION IJ SOLN
INTRAMUSCULAR | Status: AC
  Filled 2013-11-13: qty 10

## 2013-11-13 MED ORDER — FENTANYL CITRATE 0.05 MG/ML IJ SOLN
INTRAMUSCULAR | Status: DC | PRN
  Administered 2013-11-13 (×2): 25 ug via INTRAVENOUS

## 2013-11-13 MED ORDER — OMEPRAZOLE 40 MG PO CPDR: 40.0000 mg | DELAYED_RELEASE_CAPSULE | Freq: Every day | ORAL | Status: AC

## 2013-11-13 MED ORDER — MIDAZOLAM HCL 10 MG/2ML IJ SOLN
INTRAMUSCULAR | Status: AC
  Filled 2013-11-13: qty 2

## 2013-11-13 MED ORDER — OMEPRAZOLE 40 MG PO CPDR: 40.0000 mg | DELAYED_RELEASE_CAPSULE | Freq: Every day | ORAL | Status: DC

## 2013-11-13 MED ORDER — FENTANYL CITRATE 0.05 MG/ML IJ SOLN
INTRAMUSCULAR | Status: AC
  Filled 2013-11-13: qty 2

## 2013-11-13 MED ORDER — SODIUM CHLORIDE 0.9 % IV SOLN
INTRAVENOUS | Status: DC
  Administered 2013-11-13: 03:00:00 via INTRAVENOUS

## 2013-11-13 NOTE — Discharge Instructions (Addendum)
I removed food from the esophagus. Not sure why it was caught there but could have been some narrowing there - you might not have chewed well enough, either.  You may spit up a little bit of blood today but should not see large amounts if so call our number  1) You need to stay on soft foods for now or at least chew foods very well and cut meat into pieces the size of a fingernail. 2) Star omeprazole 40 mg each morning before breakfast - treats acid reflux which may be causing your problem even if you do not have heartburn 3) My office will contact you to arrange an endoscopy exam at [facility] on [address] - it might be time for your routine colonoscopy as well and we can do both  I appreciate the opportunity to care for you. Iva Booparl E. Gessner, MD, FACG  YOU HAD AN ENDOSCOPIC PROCEDURE TODAY: Refer to the procedure report and other information in the discharge instructions given to you for any specific questions about what was found during the examination. If this information does not answer your questions, please call Dr. Marvell FullerGessner's office at [phone] to clarify.   YOU SHOULD EXPECT: Some feelings of bloating in the abdomen. Passage of more gas than usual. Walking can help get rid of the air that was put into your GI tract during the procedure and reduce the bloating. If you had a lower endoscopy (such as a colonoscopy or flexible sigmoidoscopy) you may notice spotting of blood in your stool or on the toilet paper. Some abdominal soreness may be present for a day or two, also.  DIET: Your first meal following the procedure should be a light meal and then it is ok to progress to your normal diet. A half-sandwich or bowl of soup is an example of a good first meal. Heavy or fried foods are harder to digest and may make you feel nauseous or bloated. Drink plenty of fluids but you should avoid alcoholic beverages for 24 hours.   ACTIVITY: Your care partner should take you home directly  after the procedure. You should plan to take it easy, moving slowly for the rest of the day. You can resume normal activity the day after the procedure however YOU SHOULD NOT DRIVE, use power tools, machinery or perform tasks that involve climbing or major physical exertion for 24 hours (because of the sedation medicines used during the test).   SYMPTOMS TO REPORT IMMEDIATELY: A gastroenterologist can be reached at any hour. Please call [phone]  for any of the following symptoms:   Following upper endoscopy (EGD, EUS, ERCP, esophageal dilation) Vomiting of blood or coffee ground material  New, significant abdominal pain  New, significant chest pain or pain under the shoulder blades  Painful or persistently difficult swallowing  New shortness of breath  Black, tarry-looking or red, bloody stools  FOLLOW UP:  If any biopsies were taken you will be contacted by phone or by letter within the next 1-3 weeks. Call [phone]  if you have not heard about the biopsies in 3 weeks.  Please also call with any specific questions about appointments or follow up tests.  Gastrointestinal Endoscopy Care After Refer to this sheet in the next few weeks. These instructions provide you with information on caring for yourself after your procedure. Your caregiver may also give you more specific instructions. Your treatment has been planned according to current medical practices, but problems sometimes occur. Call your caregiver if  you have any problems or questions after your procedure. HOME CARE INSTRUCTIONS  If you were given medicine to help you relax (sedative), do not drive, operate machinery, or sign important documents for 24 hours.  Avoid alcohol and hot or warm beverages for the first 24 hours after the procedure.  Only take over-the-counter or prescription medicines for pain, discomfort, or fever as directed by your caregiver. You may resume taking your normal medicines unless your caregiver  tells you otherwise. Ask your caregiver when you may resume taking medicines that may cause bleeding, such as aspirin, clopidogrel, or warfarin.  You may return to your normal diet and activities on the day after your procedure, or as directed by your caregiver. Walking may help to reduce any bloated feeling in your abdomen.  Drink enough fluids to keep your urine clear or pale yellow.  You may gargle with salt water if you have a sore throat. SEEK IMMEDIATE MEDICAL CARE IF:  You have severe nausea or vomiting.  You have severe abdominal pain, abdominal cramps that last longer than 6 hours, or abdominal swelling (distention).  You have severe shoulder or back pain.  You have trouble swallowing.  You have shortness of breath, your breathing is shallow, or you are breathing faster than normal.  You have a fever or a rapid heartbeat.  You vomit blood or material that looks like coffee grounds.  You have bloody, black, or tarry stools. MAKE SURE YOU:  Understand these instructions.  Will watch your condition.  Will get help right away if you are not doing well or get worse. Document Released: 03/14/2004 Document Revised: 01/30/2012 Document Reviewed: 10/31/2011 [facility] Patient Information 2014 [place], [place].

## 2013-11-13 NOTE — ED Notes (Addendum)
Pt presents with c/o foreign body in throat. Pt was eating dinner last night around 7:15 and pt was eating some steak that he knew was too big when he swallowed it. Pt says he feels like the object is still stuck in his throat, able to move air but is not able to swallow anything else. Pt is spitting up saliva per EMS. [facility] [place].

## 2013-11-13 NOTE — ED Notes (Signed)
Pt was able to take 2 sips of water w/o emesis. Pt did cough and felt like he needed to.

## 2013-11-13 NOTE — ED Provider Notes (Signed)
CSN: [id number]     Arrival date & time 11/13/13  0133 History   First MD Initiated Contact with Patient 11/13/13 0147     Chief Complaint  Patient presents with  . Swallowed Foreign Body     (Consider location/radiation/quality/duration/timing/severity/associated sxs/prior Treatment) HPI History provided by patient. 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 He began vomiting, and has had persistent gagging since event. He has tried to swallow water but states eventually he vomits it back up. No history of same. Symptoms moderate to severe. No known alleviating factors.  Past Medical History  Diagnosis Date  . DM (diabetes mellitus)   . Other and unspecified hyperlipidemia   . HTN (hypertension)   . CAD (coronary artery disease)   . Unspecified hypothyroidism    Past Surgical History  Procedure Laterality Date  . Angioplasty  08/2006    Dr.Hochrein  . Colonoscopy w/ polypectomy  2005  . Coronary artery bypass graft  1993  . Tonsillectomy    . Foot surgery      Multiple surgeries for congenital deformities   . Cholecystectomy     Family History  Problem Relation Age of Onset  . Macular degeneration Father   . Coronary artery disease Father     CABG X 2  . Stroke Father   . Alzheimer's disease Mother   . Diabetes Brother     (+/-) "borderline  . Diabetes Maternal Aunt     X 2  . Diabetes Maternal Grandmother    History  Substance Use Topics  . Smoking status: Current Every Day Smoker -- 0.25 packs/day    Types: Cigarettes  . Smokeless tobacco: Not on file     Comment: Age 79 , smokes about 6 cigs/ daily   . Alcohol Use: Yes     Comment: Occasionally     Review of Systems  Constitutional: Negative for fever and chills.  HENT: Positive for trouble swallowing.   Respiratory: Negative for shortness of breath.   Cardiovascular: Negative for chest pain.  Gastrointestinal: Negative for abdominal  pain.  Genitourinary: Negative for flank pain.  Musculoskeletal: Negative for back pain.  Skin: Negative for rash.  Neurological: Negative for headaches.  All other systems reviewed and are negative.      Allergies  Review of patient's allergies indicates no known allergies.  Home Medications   Current Outpatient Rx  Name  Route  Sig  Dispense  Refill  . amylase-lipase-protease (PANCREASE MT 4) 07-18-11 MU per capsule   Oral   Take 1 capsule by mouth daily before breakfast.          . atenolol (TENORMIN) 50 MG tablet   Oral   Take 1 tablet (50 mg total) by mouth daily.   90 tablet   1   . benazepril (LOTENSIN) 20 MG tablet   Oral   Take 20 mg by mouth daily.           . cholecalciferol (VITAMIN D) 1000 UNITS tablet   Oral   Take 1,000 Units by mouth daily.         . cyanocobalamin (,VITAMIN B-12,) 1000 MCG/ML injection   Intramuscular   Inject 1,000 mcg into the muscle every 30 (thirty) days.         . insulin detemir (LEVEMIR) 100 UNIT/ML injection      Average your fasting sugars each week; increase the daily insulin dose by 2  units each day each week until the average fasting blood sugar is 140-160. At that time stop increasing the dose & repeat the A1c and urine microalbumin.   10 mL   12   . levothyroxine (SYNTHROID, LEVOTHROID) 100 MCG tablet   Oral   Take 1 tablet (100 mcg total) by mouth daily.   90 tablet   3   . metFORMIN (GLUCOPHAGE) 1000 MG tablet   Oral   Take 1 tablet (1,000 mg total) by mouth 2 (two) times daily with a meal.   180 tablet   0   . metroNIDAZOLE (METROGEL) 1 % gel   Topical   Apply 1 application topically daily.   45 g   5   . simvastatin (ZOCOR) 40 MG tablet   Oral   Take 1 tablet (40 mg total) by mouth at bedtime.   30 tablet   0    BP 166/65  Pulse 67  Temp(Src) 98.6 F (37 C) (Oral)  Resp 15  SpO2 98% Physical Exam  Constitutional: He is oriented to person, place, and time. He appears well-developed  and well-nourished.  HENT:  Head: Normocephalic and atraumatic.  Mouth/Throat: Oropharynx is clear and moist.  Eyes: EOM are normal. Pupils are equal, round, and reactive to light.  Neck: Neck supple.  Cardiovascular: Normal rate, regular rhythm and intact distal pulses.   Pulmonary/Chest: Effort normal and breath sounds normal. No respiratory distress.  Abdominal: Soft. Bowel sounds are normal. He exhibits no distension. There is no tenderness.  Musculoskeletal: Normal range of motion. He exhibits no edema.  Neurological: He is alert and oriented to person, place, and time.  Skin: Skin is warm and dry.    ED Course  Procedures (including critical care time) Labs Review Labs Reviewed  CBC - Abnormal; Notable for the following:    Hemoglobin 12.2 (*)    HCT 37.2 (*)    All other components within normal limits  I-STAT CHEM 8, ED - Abnormal; Notable for the following:    Creatinine, Ser 1.40 (*)    Glucose, Bld 251 (*)    All other components within normal limits   Imaging Review Dg Chest Portable 1 View  11/13/2013   CLINICAL DATA:  Choked on piece of steak.  Difficulty swallowing.  EXAM: PORTABLE CHEST - 1 VIEW  COMPARISON:  Chest radiograph performed 10/18/2012  FINDINGS: The proximal esophagus appears distended with air; this raises suspicion for a piece of steak lodged in the distal esophagus.  The lungs are relatively well aerated and appear grossly clear. Pulmonary vascularity is at the upper limits of normal. No pleural effusion or pneumothorax is seen.  The cardiomediastinal silhouette is borderline enlarged. The patient is status post median sternotomy. No acute osseous abnormalities are seen.  IMPRESSION: 1. The proximal esophagus appears distended with air; this raises suspicion for a piece of steak lodged in the distal esophagus. Alternatively, this could reflect swallowing of air. 2. No acute cardiopulmonary process seen; borderline cardiomegaly noted.  These results were  called by telephone at the time of interpretation on 11/13/2013 at 2:55 AM to Dr. Sunnie Nielsen , who verbally acknowledged these results.   Electronically Signed   By: Roanna Raider M.D.   On: 11/13/2013 02:57   Unable to swallow water. IVFs initiated.   3:24 AM d/w GI Dr Leone Payor to see in ED, will try glucagon now.  PT to endoscopy without any changes in the ED  MDM   Dx: esophageal food impaction  Labs and xray as above - mildly elevated crt IVFs, zofran, glucagon GI took PT to endoscopy, will dispo    Sunnie Nielsen, MD 11/13/13 [phone]

## 2013-11-13 NOTE — ED Notes (Signed)
Becky, RN from Endo to transport pt.

## 2013-11-13 NOTE — H&P (Signed)
[name] History and Physical   Primary Care Physician:  Marga Melnick, MD   Reason for Procedure:   Esophageal food impaction  Plan:    EGD, remove food impaction, possible dilation     HPI: James [name] is a 79 y.o. male with a history of inability to swallow after eating steak last night. Some mild chest pressure after that. One time in past year had a similar, less severe episode. No prior dil;ations of esophagus or GERD issues that I am aware of.    Past Medical History  Diagnosis Date  . DM (diabetes mellitus)   . Other and unspecified hyperlipidemia   . HTN (hypertension)   . CAD (coronary artery disease)   . Unspecified hypothyroidism     Past Surgical History  Procedure Laterality Date  . Angioplasty  08/2006    Dr.Hochrein  . Colonoscopy w/ polypectomy  2005  . Coronary artery bypass graft  1993  . Tonsillectomy    . Foot surgery      Multiple surgeries for congenital deformities   . Cholecystectomy      Prior to Admission medications   Medication Sig Start Date End Date Taking? Authorizing Provider  amylase-lipase-protease (PANCREASE MT 4) 07-18-11 MU per capsule Take 1 capsule by mouth daily before breakfast.    Yes Historical Provider, MD  atenolol (TENORMIN) 50 MG tablet Take 1 tablet (50 mg total) by mouth daily. 09/18/12  Yes Pecola Lawless, MD  benazepril (LOTENSIN) 20 MG tablet Take 20 mg by mouth daily.     Yes Historical Provider, MD  cholecalciferol (VITAMIN D) 1000 UNITS tablet Take 1,000 Units by mouth daily.   Yes Historical Provider, MD  cyanocobalamin 500 MCG tablet Take 500 mcg by mouth daily.   Yes Historical Provider, MD  insulin aspart (NOVOLOG) 100 UNIT/ML injection Inject 5 Units into the skin 3 (three) times daily before meals.   Yes Historical Provider, MD  insulin detemir (LEVEMIR) 100 UNIT/ML injection Inject 20 Units into the skin at bedtime.   Yes Historical Provider, MD  levothyroxine (SYNTHROID,  LEVOTHROID) 100 MCG tablet Take 100 mcg by mouth daily before breakfast.   Yes Historical Provider, MD  levothyroxine (SYNTHROID, LEVOTHROID) 150 MCG tablet Take 150 mcg by mouth every Wednesday.   Yes Historical Provider, MD  metFORMIN (GLUCOPHAGE) 1000 MG tablet Take 1 tablet (1,000 mg total) by mouth 2 (two) times daily with a meal. 02/05/13  Yes Pecola Lawless, MD  simvastatin (ZOCOR) 20 MG tablet Take 30 mg by mouth daily.   Yes Historical Provider, MD  sitaGLIPtin (JANUVIA) 100 MG tablet Take 100 mg by mouth daily.   Yes Historical Provider, MD    Current Facility-Administered Medications  Medication Dose Route Frequency Provider Last Rate Last Dose  . 0.9 %  sodium chloride infusion   Intravenous Continuous Sunnie Nielsen, MD 125 mL/hr at 11/13/13 0239    . 0.9 %  sodium chloride infusion   Intravenous Continuous Iva Boop, MD        Allergies as of 11/13/2013  . (No Known Allergies)    Family History  Problem Relation Age of Onset  . Macular degeneration Father   . Coronary artery disease Father     CABG X 2  . Stroke Father   . Alzheimer's disease Mother   . Diabetes Brother     (+/-) "borderline  . Diabetes Maternal Aunt     X 2  . Diabetes  Maternal Grandmother     History   Social History  . Marital Status: Single    Spouse Name: N/A    Number of Children: N/A  . Years of Education: N/A   Occupational History  . retired   Social History Main Topics  . Smoking status: Current Every Day Smoker -- 0.25 packs/day    Types: Cigarettes  . Smokeless tobacco: Not on file     Comment: Age 79 , smokes about 6 cigs/ daily   . Alcohol Use: Yes     Comment: Occasionally   . Drug Use: No     Review of Systems: As mentioned in the HPI.  Physical Exam: Vital signs in last 24 hours: Temp:  [98.6 F (37 C)] 98.6 F (37 C) (04/02 0139) Pulse Rate:  [65-98] 98 (04/02 0651) Resp:  [15-18] 18 (04/02 0651) BP: (131-166)/(51-78) 131/78 mmHg (04/02 0651) SpO2:   [95 %-98 %] 96 % (04/02 0651)   General:   Alert,  Well-developed, well-nourished, pleasant and cooperative in mild distress - spitting/coughing Lungs:  Clear throughout to auscultation.   Heart:  Regular rate and rhythm; no murmurs, clicks, rubs,  or gallops. Abdomen:  Soft, nontender and nondistended. Normal bowel sounds.   Neuro/Psych:  Alert and cooperative. Normal mood and affect. A and O x 3   @Zonie [name]  Sena SlateE. Giannina Bartolome, MD, [facility] [phone] (pager) 11/13/2013 7:11 AM@

## 2013-11-13 NOTE — Op Note (Signed)
[facility] [address] [place], [id number]   ENDOSCOPY PROCEDURE REPORT  PATIENT: James [name], James [name]  MR#: [id number] BIRTHDATE: 1935-04-11 , 78  yrs. old GENDER: Male ENDOSCOPIST: Iva Booparl E Gessner, MD, [facility] PROCEDURE DATE:  11/13/2013 PROCEDURE:  EGD w/ fb removal ASA CLASS:     Class III INDICATIONS:  food impaction treatment. MEDICATIONS: Fentanyl 50 mcg IV and Versed 5 mg IV TOPICAL ANESTHETIC: none  DESCRIPTION OF PROCEDURE: After the risks benefits and alternatives of the procedure were thoroughly explained, informed consent was obtained.  The Pentax Gastroscope [id number] endoscope was introduced through the mouth and advanced to the second portion of the duodenum. Slight limitations with some food in stomach.  The instrument was slowly withdrawn as the mucosa was fully examined.    1) Food impaction in distal esophagus - removed by chopping with snare and advancing into scope using gentle scope pressue = no strictue seen but some mild bleeding from trauma 2) remainedr of exam normal, stomach exam slightly limited by some retained food/fluid in proximal stomach.  retroflexion done but omitted photo.  Retroflexed views revealed no abnormalities. The scope was then withdrawn from the patient and the procedure completed.  COMPLICATIONS: There were no complications. ENDOSCOPIC IMPRESSION: 1) Food impaction in distal esophagus - removed by chopping with snare and advancing into scope using gentle scope pressue = no strictue seen but some mild bleeding from trauma 2) remainedr of exam normal, stomach exam slightly limited by some retained food/fluid in proximal stomach.  retroflexion done but omitted photo  RECOMMENDATIONS: Start omeprazole 40 mg daily Soft foods for now/chew well My office will arrange a follow-up endoscopy with possible dilation at [facility] to look for possible stricture   eSigned:  Iva Booparl E Gessner, MD, [facility] 11/13/2013  7:54 AM   CC:The Patient

## 2013-11-13 NOTE — ED Notes (Signed)
Bed: ZO10WA18 Expected date:  Expected time:  Means of arrival:  Comments: EMS foreign body to throat

## 2013-11-17 ENCOUNTER — Encounter (HOSPITAL_COMMUNITY): Payer: Self-pay | Admitting: Internal Medicine

## 2013-11-18 ENCOUNTER — Telehealth: Payer: Self-pay

## 2013-11-18 NOTE — Telephone Encounter (Signed)
Per procedure report patient needs repeat EGD with dil and a screening colonoscopy Left message for patient to call back

## 2013-11-20 NOTE — Telephone Encounter (Signed)
Left message for patient to call back  

## 2013-11-20 NOTE — Telephone Encounter (Signed)
Patient notified of Dr. Marvell FullerGessner's recommendations to have endo/colon.  Patient agrees to have EGD dilation, but refuses colonoscopy.  He is scheduled for EGD dil [facility] 12/04/13 and pre-visit on 11/25/13

## 2013-11-25 ENCOUNTER — Ambulatory Visit (AMBULATORY_SURGERY_CENTER): Payer: Self-pay

## 2013-11-25 VITALS — Ht 70.0 in | Wt 205.0 lb

## 2013-11-25 DIAGNOSIS — R1319 Other dysphagia: Secondary | ICD-10-CM

## 2013-11-27 ENCOUNTER — Other Ambulatory Visit: Payer: Self-pay | Admitting: *Deleted

## 2013-12-04 ENCOUNTER — Encounter: Payer: Self-pay | Admitting: Internal Medicine

## 2013-12-04 ENCOUNTER — Ambulatory Visit (AMBULATORY_SURGERY_CENTER): Payer: Medicare Other | Admitting: Internal Medicine

## 2013-12-04 VITALS — BP 125/75 | HR 59 | Temp 96.3°F | Resp 14 | Ht 70.0 in | Wt 205.0 lb

## 2013-12-04 DIAGNOSIS — R1319 Other dysphagia: Secondary | ICD-10-CM

## 2013-12-04 DIAGNOSIS — K2270 Barrett's esophagus without dysplasia: Secondary | ICD-10-CM

## 2013-12-04 LAB — GLUCOSE, CAPILLARY
GLUCOSE-CAPILLARY: 112 mg/dL — AB (ref 70–99)
Glucose-Capillary: 175 mg/dL — ABNORMAL HIGH (ref 70–99)

## 2013-12-04 MED ORDER — SODIUM CHLORIDE 0.9 % IV SOLN: 500.0000 mL | INTRAVENOUS | Status: DC

## 2013-12-04 NOTE — Progress Notes (Signed)
Called to room to assist during endoscopic procedure.  Patient ID and intended procedure confirmed with present staff. Received instructions for my participation in the procedure from the performing physician.  

## 2013-12-04 NOTE — Op Note (Signed)
[facility] [address] [place], [id number]   ENDOSCOPY PROCEDURE REPORT  PATIENT: [name], James  MR#: [id number] BIRTHDATE: 1935/06/02 , 78  yrs. old GENDER: Male ENDOSCOPIST: Iva Booparl E Asiah Browder, MD, [facility] PROCEDURE DATE:  12/04/2013 PROCEDURE:  Esophagoscopy  w/ Biopsy ASA CLASS:     Class III INDICATIONS:  Dysphagia.  Hx food impaction. MEDICATIONS: propofol (Diprivan) 100mg  IV, MAC sedation, administered by CRNA, and These medications were titrated to patient response per physician's verbal order TOPICAL ANESTHETIC: Cetacaine Spray  DESCRIPTION OF PROCEDURE: After the risks benefits and alternatives of the procedure were thoroughly explained, informed consent was obtained.  The LB [id number] endoscope was introduced through the mouth and advanced to the stomach antrum. Without limitations.  The instrument was slowly withdrawn as the mucosa was fully examined.        ESOPHAGUS: There was evidence of suspected Barrett's esophagus at the gastroesophageal junction.  a 1 cm max tongue of columnar mucosa.  A biopsy was performed using cold forceps.  Sample sent for histology.   No strictures seen.  The remainder of the upper endoscopy exam was otherwise normal. Retroflexed views revealed no abnormalities.     The scope was then withdrawn from the patient and the procedure completed.  COMPLICATIONS: There were no complications. ENDOSCOPIC IMPRESSION: 1.   There was evidence of suspected Barrett's esophagus; biopsy 2.   The remainder of the upper endoscopy exam was otherwise normal  RECOMMENDATIONS: 1.  Continue PPI 2.  Await pathology results    eSigned:  Iva Booparl E Carlon Davidson, MD, [facility] 12/04/2013 10:54 AM   WN:[id number]:[employer], MD and The Patient

## 2013-12-04 NOTE — Patient Instructions (Addendum)
I did not see any narrow areas or strictures but there was a possible abnormality of the lining of the esophagus. Barrett's esophagus is possible - this is a change that can be pre-cancerous but it is extremely rare that cancer may occur.  Please be sure to stay on the omeprazole always.  I will send a letter about the pathology results and plans.  I appreciate the opportunity to care for you. Iva Booparl E. Gessner, MD, FACG  YOU HAD AN ENDOSCOPIC PROCEDURE TODAY AT THE [facility]: Refer to the procedure report that was given to you for any specific questions about what was found during the examination.  If the procedure report does not answer your questions, please call your gastroenterologist to clarify.  If you requested that your care partner not be given the details of your procedure findings, then the procedure report has been included in a sealed envelope for you to review at your convenience later.  YOU SHOULD EXPECT: Some feelings of bloating in the abdomen. Passage of more gas than usual.  Walking can help get rid of the air that was put into your GI tract during the procedure and reduce the bloating. If you had a lower endoscopy (such as a colonoscopy or flexible sigmoidoscopy) you may notice spotting of blood in your stool or on the toilet paper. If you underwent a bowel prep for your procedure, then you may not have a normal bowel movement for a few days.  DIET: Your first meal following the procedure should be a light meal and then it is ok to progress to your normal diet.  A half-sandwich or bowl of soup is an example of a good first meal.  Heavy or fried foods are harder to digest and may make you feel nauseous or bloated.  Likewise meals heavy in dairy and vegetables can cause extra gas to form and this can also increase the bloating.  Drink plenty of fluids but you should avoid alcoholic beverages for 24 hours.  ACTIVITY: Your care partner should take you home directly after  the procedure.  You should plan to take it easy, moving slowly for the rest of the day.  You can resume normal activity the day after the procedure however you should NOT DRIVE or use heavy machinery for 24 hours (because of the sedation medicines used during the test).    SYMPTOMS TO REPORT IMMEDIATELY: A gastroenterologist can be reached at any hour.  During normal business hours, 8:30 AM to 5:00 PM Monday through Friday, call [phone].  After hours and on weekends, please call the GI answering service at [phone] who will take a message and have the physician on call contact you.    Following upper endoscopy (EGD)  Vomiting of blood or coffee ground material  New chest pain or pain under the shoulder blades  Painful or persistently difficult swallowing  New shortness of breath  Fever of 100F or higher  Black, tarry-looking stools  FOLLOW UP: If any biopsies were taken you will be contacted by phone or by letter within the next 1-3 weeks.  Call your gastroenterologist if you have not heard about the biopsies in 3 weeks.  Our staff will call the home number listed on your records the next business day following your procedure to check on you and address any questions or concerns that you may have at that time regarding the information given to you following your procedure. This is a courtesy call and so  if there is no answer at the home number and we have not heard from you through the emergency physician on call, we will assume that you have returned to your regular daily activities without incident.  SIGNATURES/CONFIDENTIALITY: You and/or your care partner have signed paperwork which will be entered into your electronic medical record.  These signatures attest to the fact that that the information above on your After Visit Summary has been reviewed and is understood.  Full responsibility of the confidentiality of this discharge information lies with you and/or your care-partner.

## 2013-12-05 ENCOUNTER — Telehealth: Payer: Self-pay | Admitting: *Deleted

## 2013-12-05 NOTE — Telephone Encounter (Signed)
LMOM.  Patient identifier.  Advised patient to call back if any questions or concerns. 

## 2013-12-11 ENCOUNTER — Encounter: Payer: Self-pay | Admitting: Internal Medicine

## 2013-12-11 DIAGNOSIS — K2270 Barrett's esophagus without dysplasia: Secondary | ICD-10-CM

## 2013-12-11 HISTORY — DX: Barrett's esophagus without dysplasia: K22.70

## 2013-12-11 NOTE — Progress Notes (Signed)
Quick Note:  Intestinal metaplasia consistent with Barrett's esophagus Will consider a repeat EGD in 3 - 5years - given age that may not be needed ______

## 2015-02-13 ENCOUNTER — Inpatient Hospital Stay (HOSPITAL_COMMUNITY): Payer: Medicare Other

## 2015-02-13 ENCOUNTER — Encounter (HOSPITAL_COMMUNITY): Payer: Self-pay | Admitting: Emergency Medicine

## 2015-02-13 ENCOUNTER — Inpatient Hospital Stay (HOSPITAL_COMMUNITY)
Admission: EM | Admit: 2015-02-13 | Discharge: 2015-03-15 | DRG: 871 | Disposition: E | Payer: Medicare Other | Attending: Internal Medicine | Admitting: Internal Medicine

## 2015-02-13 ENCOUNTER — Emergency Department (HOSPITAL_COMMUNITY): Payer: Medicare Other

## 2015-02-13 DIAGNOSIS — D638 Anemia in other chronic diseases classified elsewhere: Secondary | ICD-10-CM | POA: Diagnosis present

## 2015-02-13 DIAGNOSIS — I469 Cardiac arrest, cause unspecified: Secondary | ICD-10-CM

## 2015-02-13 DIAGNOSIS — R4182 Altered mental status, unspecified: Secondary | ICD-10-CM | POA: Diagnosis present

## 2015-02-13 DIAGNOSIS — R001 Bradycardia, unspecified: Secondary | ICD-10-CM | POA: Diagnosis present

## 2015-02-13 DIAGNOSIS — Z823 Family history of stroke: Secondary | ICD-10-CM | POA: Diagnosis not present

## 2015-02-13 DIAGNOSIS — A419 Sepsis, unspecified organism: Secondary | ICD-10-CM | POA: Diagnosis present

## 2015-02-13 DIAGNOSIS — Z833 Family history of diabetes mellitus: Secondary | ICD-10-CM | POA: Diagnosis not present

## 2015-02-13 DIAGNOSIS — R451 Restlessness and agitation: Secondary | ICD-10-CM | POA: Diagnosis present

## 2015-02-13 DIAGNOSIS — E039 Hypothyroidism, unspecified: Secondary | ICD-10-CM | POA: Diagnosis present

## 2015-02-13 DIAGNOSIS — Z79899 Other long term (current) drug therapy: Secondary | ICD-10-CM | POA: Diagnosis not present

## 2015-02-13 DIAGNOSIS — J9690 Respiratory failure, unspecified, unspecified whether with hypoxia or hypercapnia: Secondary | ICD-10-CM

## 2015-02-13 DIAGNOSIS — I2510 Atherosclerotic heart disease of native coronary artery without angina pectoris: Secondary | ICD-10-CM | POA: Diagnosis present

## 2015-02-13 DIAGNOSIS — B379 Candidiasis, unspecified: Secondary | ICD-10-CM | POA: Diagnosis present

## 2015-02-13 DIAGNOSIS — Z8249 Family history of ischemic heart disease and other diseases of the circulatory system: Secondary | ICD-10-CM

## 2015-02-13 DIAGNOSIS — R579 Shock, unspecified: Secondary | ICD-10-CM | POA: Diagnosis present

## 2015-02-13 DIAGNOSIS — Z794 Long term (current) use of insulin: Secondary | ICD-10-CM | POA: Diagnosis not present

## 2015-02-13 DIAGNOSIS — J9601 Acute respiratory failure with hypoxia: Secondary | ICD-10-CM | POA: Diagnosis not present

## 2015-02-13 DIAGNOSIS — R197 Diarrhea, unspecified: Secondary | ICD-10-CM | POA: Diagnosis present

## 2015-02-13 DIAGNOSIS — D696 Thrombocytopenia, unspecified: Secondary | ICD-10-CM | POA: Diagnosis present

## 2015-02-13 DIAGNOSIS — L89151 Pressure ulcer of sacral region, stage 1: Secondary | ICD-10-CM | POA: Diagnosis present

## 2015-02-13 DIAGNOSIS — M21969 Unspecified acquired deformity of unspecified lower leg: Secondary | ICD-10-CM | POA: Diagnosis present

## 2015-02-13 DIAGNOSIS — I10 Essential (primary) hypertension: Secondary | ICD-10-CM | POA: Diagnosis present

## 2015-02-13 DIAGNOSIS — N179 Acute kidney failure, unspecified: Secondary | ICD-10-CM | POA: Diagnosis present

## 2015-02-13 DIAGNOSIS — Z951 Presence of aortocoronary bypass graft: Secondary | ICD-10-CM | POA: Diagnosis not present

## 2015-02-13 DIAGNOSIS — R410 Disorientation, unspecified: Secondary | ICD-10-CM | POA: Diagnosis present

## 2015-02-13 DIAGNOSIS — F17210 Nicotine dependence, cigarettes, uncomplicated: Secondary | ICD-10-CM | POA: Diagnosis present

## 2015-02-13 DIAGNOSIS — E875 Hyperkalemia: Secondary | ICD-10-CM | POA: Diagnosis present

## 2015-02-13 DIAGNOSIS — Z66 Do not resuscitate: Secondary | ICD-10-CM | POA: Diagnosis present

## 2015-02-13 DIAGNOSIS — Z7982 Long term (current) use of aspirin: Secondary | ICD-10-CM | POA: Diagnosis not present

## 2015-02-13 DIAGNOSIS — E119 Type 2 diabetes mellitus without complications: Secondary | ICD-10-CM | POA: Diagnosis present

## 2015-02-13 DIAGNOSIS — E872 Acidosis: Secondary | ICD-10-CM | POA: Diagnosis present

## 2015-02-13 DIAGNOSIS — E785 Hyperlipidemia, unspecified: Secondary | ICD-10-CM | POA: Diagnosis present

## 2015-02-13 DIAGNOSIS — K2270 Barrett's esophagus without dysplasia: Secondary | ICD-10-CM | POA: Diagnosis present

## 2015-02-13 DIAGNOSIS — J189 Pneumonia, unspecified organism: Secondary | ICD-10-CM | POA: Diagnosis present

## 2015-02-13 DIAGNOSIS — N19 Unspecified kidney failure: Secondary | ICD-10-CM | POA: Diagnosis present

## 2015-02-13 DIAGNOSIS — Z452 Encounter for adjustment and management of vascular access device: Secondary | ICD-10-CM

## 2015-02-13 LAB — CBC WITH DIFFERENTIAL/PLATELET
BASOS ABS: 0 10*3/uL (ref 0.0–0.1)
Basophils Relative: 0 % (ref 0–1)
Eosinophils Absolute: 0.2 10*3/uL (ref 0.0–0.7)
Eosinophils Relative: 2 % (ref 0–5)
HEMATOCRIT: 38 % — AB (ref 39.0–52.0)
Hemoglobin: 12.7 g/dL — ABNORMAL LOW (ref 13.0–17.0)
LYMPHS PCT: 15 % (ref 12–46)
Lymphs Abs: 1.6 10*3/uL (ref 0.7–4.0)
MCH: 29 pg (ref 26.0–34.0)
MCHC: 33.4 g/dL (ref 30.0–36.0)
MCV: 86.8 fL (ref 78.0–100.0)
Monocytes Absolute: 0.9 10*3/uL (ref 0.1–1.0)
Monocytes Relative: 8 % (ref 3–12)
NEUTROS ABS: 8 10*3/uL — AB (ref 1.7–7.7)
NEUTROS PCT: 75 % (ref 43–77)
Platelets: 237 10*3/uL (ref 150–400)
RBC: 4.38 MIL/uL (ref 4.22–5.81)
RDW: 16.5 % — ABNORMAL HIGH (ref 11.5–15.5)
WBC: 10.8 10*3/uL — ABNORMAL HIGH (ref 4.0–10.5)

## 2015-02-13 LAB — URINALYSIS, ROUTINE W REFLEX MICROSCOPIC
Glucose, UA: NEGATIVE mg/dL
Ketones, ur: 15 mg/dL — AB
Nitrite: NEGATIVE
PROTEIN: 100 mg/dL — AB
Specific Gravity, Urine: 1.019 (ref 1.005–1.030)
Urobilinogen, UA: 0.2 mg/dL (ref 0.0–1.0)
pH: 5 (ref 5.0–8.0)

## 2015-02-13 LAB — I-STAT TROPONIN, ED: TROPONIN I, POC: 0.05 ng/mL (ref 0.00–0.08)

## 2015-02-13 LAB — URINE MICROSCOPIC-ADD ON

## 2015-02-13 LAB — COMPREHENSIVE METABOLIC PANEL
ALBUMIN: 4 g/dL (ref 3.5–5.0)
ALK PHOS: 119 U/L (ref 38–126)
ALT: 23 U/L (ref 17–63)
ANION GAP: 17 — AB (ref 5–15)
AST: 21 U/L (ref 15–41)
BUN: 204 mg/dL — AB (ref 6–20)
CO2: 9 mmol/L — AB (ref 22–32)
CREATININE: 17.34 mg/dL — AB (ref 0.61–1.24)
Calcium: 8.8 mg/dL — ABNORMAL LOW (ref 8.9–10.3)
Chloride: 113 mmol/L — ABNORMAL HIGH (ref 101–111)
GFR calc Af Amer: 3 mL/min — ABNORMAL LOW (ref >60)
GFR calc non Af Amer: 2 mL/min — ABNORMAL LOW (ref >60)
GLUCOSE: 106 mg/dL — AB (ref 65–99)
Potassium: >7.5 mmol/L (ref 3.5–5.1)
Sodium: 139 mmol/L (ref 135–145)
Total Bilirubin: 0.4 mg/dL (ref 0.3–1.2)
Total Protein: 7.1 g/dL (ref 6.5–8.1)

## 2015-02-13 LAB — GLUCOSE, CAPILLARY: Glucose-Capillary: 114 mg/dL — ABNORMAL HIGH (ref 65–99)

## 2015-02-13 LAB — TSH: TSH: 0.615 u[IU]/mL (ref 0.350–4.500)

## 2015-02-13 LAB — CBG MONITORING, ED: Glucose-Capillary: 94 mg/dL (ref 65–99)

## 2015-02-13 LAB — T4, FREE: FREE T4: 0.85 ng/dL (ref 0.61–1.12)

## 2015-02-13 LAB — LACTIC ACID, PLASMA: Lactic Acid, Venous: 1.6 mmol/L (ref 0.5–2.0)

## 2015-02-13 LAB — AMMONIA: Ammonia: 54 umol/L — ABNORMAL HIGH (ref 9–35)

## 2015-02-13 MED ORDER — PIPERACILLIN-TAZOBACTAM 3.375 G IVPB 30 MIN
3.3750 g | Freq: Once | INTRAVENOUS | Status: AC
  Administered 2015-02-13: 3.375 g via INTRAVENOUS

## 2015-02-13 MED ORDER — CALCIUM GLUCONATE 10 % IV SOLN
1.0000 g | Freq: Once | INTRAVENOUS | Status: AC
  Administered 2015-02-13: 1 g via INTRAVENOUS
  Filled 2015-02-13: qty 10

## 2015-02-13 MED ORDER — ALBUTEROL SULFATE (2.5 MG/3ML) 0.083% IN NEBU
10.0000 mg | INHALATION_SOLUTION | Freq: Once | RESPIRATORY_TRACT | Status: DC
  Filled 2015-02-13: qty 12

## 2015-02-13 MED ORDER — HEPARIN SODIUM (PORCINE) 5000 UNIT/ML IJ SOLN
5000.0000 [IU] | Freq: Three times a day (TID) | INTRAMUSCULAR | Status: DC
  Filled 2015-02-13 (×3): qty 1

## 2015-02-13 MED ORDER — LEVOTHYROXINE SODIUM 125 MCG PO TABS
125.0000 ug | ORAL_TABLET | Freq: Every day | ORAL | Status: DC
  Filled 2015-02-13 (×2): qty 1

## 2015-02-13 MED ORDER — ALTEPLASE 2 MG IJ SOLR
2.0000 mg | Freq: Once | INTRAMUSCULAR | Status: AC | PRN
  Filled 2015-02-13: qty 2

## 2015-02-13 MED ORDER — MIDAZOLAM HCL 2 MG/2ML IJ SOLN: 2.0000 mg | Freq: Once | INTRAMUSCULAR | Status: DC

## 2015-02-13 MED ORDER — FENTANYL CITRATE (PF) 100 MCG/2ML IJ SOLN: 50.0000 ug | INTRAMUSCULAR | Status: DC | PRN

## 2015-02-13 MED ORDER — HEPARIN (PORCINE) 2000 UNITS/L FOR CRRT
INTRAVENOUS_CENTRAL | Status: DC | PRN
  Filled 2015-02-13: qty 1000

## 2015-02-13 MED ORDER — PRISMASOL BGK 0/2.5 32-2.5 MEQ/L IV SOLN
INTRAVENOUS | Status: DC
  Administered 2015-02-14: via INTRAVENOUS_CENTRAL
  Filled 2015-02-13 (×7): qty 5000

## 2015-02-13 MED ORDER — FENTANYL CITRATE (PF) 100 MCG/2ML IJ SOLN
50.0000 ug | Freq: Once | INTRAMUSCULAR | Status: AC
  Administered 2015-02-13: 50 ug via INTRAVENOUS

## 2015-02-13 MED ORDER — PRISMASOL BGK 0/2.5 32-2.5 MEQ/L IV SOLN
INTRAVENOUS | Status: DC
  Administered 2015-02-14: via INTRAVENOUS_CENTRAL
  Filled 2015-02-13: qty 5000

## 2015-02-13 MED ORDER — DEXTROSE 5 % IV SOLN
0.0000 ug/min | INTRAVENOUS | Status: DC
  Administered 2015-02-13: 10 ug/min via INTRAVENOUS
  Filled 2015-02-13: qty 4

## 2015-02-13 MED ORDER — MIDAZOLAM HCL 2 MG/2ML IJ SOLN
1.0000 mg | Freq: Once | INTRAMUSCULAR | Status: AC
  Administered 2015-02-13: 1 mg via INTRAVENOUS

## 2015-02-13 MED ORDER — SODIUM CHLORIDE 0.9 % IV BOLUS (SEPSIS)
1000.0000 mL | Freq: Once | INTRAVENOUS | Status: AC
  Administered 2015-02-13: 1000 mL via INTRAVENOUS

## 2015-02-13 MED ORDER — PIPERACILLIN-TAZOBACTAM 3.375 G IVPB 30 MIN
3.3750 g | Freq: Three times a day (TID) | INTRAVENOUS | Status: DC
  Filled 2015-02-13 (×2): qty 50

## 2015-02-13 MED ORDER — VANCOMYCIN HCL 10 G IV SOLR
1500.0000 mg | Freq: Once | INTRAVENOUS | Status: AC
  Administered 2015-02-13: 1500 mg via INTRAVENOUS
  Filled 2015-02-13: qty 1500

## 2015-02-13 MED ORDER — PANTOPRAZOLE SODIUM 40 MG PO TBEC: 40.0000 mg | DELAYED_RELEASE_TABLET | Freq: Every day | ORAL | Status: DC

## 2015-02-13 MED ORDER — FENTANYL CITRATE (PF) 100 MCG/2ML IJ SOLN: 100.0000 ug | Freq: Once | INTRAMUSCULAR | Status: DC

## 2015-02-13 MED ORDER — ALBUTEROL (5 MG/ML) CONTINUOUS INHALATION SOLN
10.0000 mg/h | INHALATION_SOLUTION | RESPIRATORY_TRACT | Status: DC
  Administered 2015-02-13: 10 mg/h via RESPIRATORY_TRACT

## 2015-02-13 MED ORDER — PRISMASOL BGK 0/2.5 32-2.5 MEQ/L IV SOLN
INTRAVENOUS | Status: DC
  Administered 2015-02-14: via INTRAVENOUS_CENTRAL
  Filled 2015-02-13 (×2): qty 5000

## 2015-02-13 MED ORDER — NYSTATIN 100000 UNIT/GM EX POWD
Freq: Once | CUTANEOUS | Status: AC
  Administered 2015-02-13: 19:00:00 via TOPICAL
  Filled 2015-02-13: qty 15

## 2015-02-13 MED ORDER — SIMVASTATIN 20 MG PO TABS
30.0000 mg | ORAL_TABLET | Freq: Every day | ORAL | Status: DC
  Filled 2015-02-13: qty 1

## 2015-02-13 MED ORDER — SODIUM CHLORIDE 0.9 % IV SOLN: 250.0000 mL | INTRAVENOUS | Status: DC | PRN

## 2015-02-13 MED ORDER — INSULIN ASPART 100 UNIT/ML ~~LOC~~ SOLN: 1.0000 [IU] | SUBCUTANEOUS | Status: DC

## 2015-02-13 MED ORDER — DEXTROSE 50 % IV SOLN
1.0000 | Freq: Once | INTRAVENOUS | Status: AC
  Administered 2015-02-13: 50 mL via INTRAVENOUS
  Filled 2015-02-13: qty 50

## 2015-02-13 MED ORDER — HEPARIN SODIUM (PORCINE) 1000 UNIT/ML DIALYSIS
1000.0000 [IU] | INTRAMUSCULAR | Status: DC | PRN
  Filled 2015-02-13: qty 6

## 2015-02-13 MED ORDER — ASPIRIN EC 81 MG PO TBEC
81.0000 mg | DELAYED_RELEASE_TABLET | Freq: Every day | ORAL | Status: DC
  Filled 2015-02-13: qty 1

## 2015-02-13 MED ORDER — INSULIN ASPART 100 UNIT/ML IV SOLN
10.0000 [IU] | Freq: Once | INTRAVENOUS | Status: AC
  Administered 2015-02-13: 10 [IU] via INTRAVENOUS
  Filled 2015-02-13: qty 1

## 2015-02-13 MED ORDER — ACETAMINOPHEN 325 MG PO TABS
650.0000 mg | ORAL_TABLET | Freq: Once | ORAL | Status: DC
  Filled 2015-02-13: qty 2

## 2015-02-13 MED ORDER — PIPERACILLIN-TAZOBACTAM IN DEX 2-0.25 GM/50ML IV SOLN
3.3750 g | Freq: Once | INTRAVENOUS | Status: DC
  Filled 2015-02-13: qty 100

## 2015-02-13 MED ORDER — FENTANYL CITRATE (PF) 100 MCG/2ML IJ SOLN
50.0000 ug | INTRAMUSCULAR | Status: DC | PRN
Start: 2015-02-13 — End: 2015-02-14

## 2015-02-13 NOTE — ED Notes (Signed)
This nurse asked MD to come to bedside d/t concerns about low BP.  Per Dr. Radford PaxBeaton pause anitbiotics to bolus pt.

## 2015-02-13 NOTE — H&P (Signed)
PULMONARY / CRITICAL CARE MEDICINE   Name: James [name] MRN: [id number] DOB: February 11, 1935    ADMISSION DATE:  03-15-2015  CHIEF COMPLAINT:  AMS  INITIAL PRESENTATION: 82 M with CAD, DM who was brought to [facility] on 7/2 for AMS and found to have severe acute renal failure.   STUDIES:  K > 7.5, CO2 9, BUN 204, Cr 17.34   SIGNIFICANT EVENTS: Planned intubated and HD catheter placment  HISTORY OF PRESENT ILLNESS:  James [name] is a 38 M with multiple medical issues including CAD, DM, and Hypothyroidism who was brought to [facility] on 7/2 with AMS. He was subsequently found to have severe renal failure of unclear etiology. The patient is currently unable to provide any history. His NOK, his brother Aurther Loft [phone]) last spoke with him several days ago and the patient wasn't complaining of anything at that point.  PAST MEDICAL HISTORY :   has a past medical history of DM (diabetes mellitus); Other and unspecified hyperlipidemia; HTN (hypertension); CAD (coronary artery disease); Unspecified hypothyroidism; Food impaction of esophagus (11/13/2013); and Barrett's esophagus (12/11/2013).  has past surgical history that includes Angioplasty (08/2006); Colonoscopy w/ polypectomy (2005); Coronary artery bypass graft (1993); Tonsillectomy; Foot surgery; Cholecystectomy; and Esophagogastroduodenoscopy (N/A, 11/13/2013). Prior to Admission medications   Medication Sig Start Date End Date Taking? Authorizing Provider  albuterol (PROVENTIL HFA;VENTOLIN HFA) 108 (90 BASE) MCG/ACT inhaler Inhale 2 puffs into the lungs every 4 (four) hours as needed for shortness of breath (cough).   Yes Historical Provider, MD  aspirin EC 81 MG tablet Take 81 mg by mouth daily.   Yes Historical Provider, MD  atenolol (TENORMIN) 50 MG tablet Take 1 tablet (50 mg total) by mouth daily. 09/18/12  Yes Pecola Lawless, MD  benazepril (LOTENSIN) 20 MG tablet Take 10 mg by mouth daily.    Yes Historical Provider, MD  benazepril-hydrochlorthiazide  (LOTENSIN HCT) 20-25 MG per tablet Take 1 tablet by mouth daily.  01/29/15  Yes Historical Provider, MD  fluorouracil (EFUDEX) 5 % cream Apply 1 application topically 2 (two) times daily. To scalp front and center for psoriasis 02/11/15  Yes Historical Provider, MD  fluticasone (FLONASE) 50 MCG/ACT nasal spray Place 1 spray into both nostrils at bedtime.  01/26/15  Yes Historical Provider, MD  insulin aspart (NOVOLOG) 100 UNIT/ML injection Inject 10 Units into the skin 3 (three) times daily before meals.    Yes Historical Provider, MD  insulin detemir (LEVEMIR) 100 UNIT/ML injection Inject 30 Units into the skin at bedtime.    Yes Historical Provider, MD  levothyroxine (SYNTHROID, LEVOTHROID) 125 MCG tablet Take 125 mcg by mouth at bedtime.  01/27/15  Yes Historical Provider, MD  metFORMIN (GLUCOPHAGE) 500 MG tablet Take 500 mg by mouth 2 (two) times daily with a meal.  02/11/15  Yes Historical Provider, MD  omeprazole (PRILOSEC) 40 MG capsule Take 1 capsule (40 mg total) by mouth daily before breakfast. 11/13/13  Yes Iva Boop, MD  simvastatin (ZOCOR) 20 MG tablet Take 30 mg by mouth daily at 6 PM.    Yes Historical Provider, MD  sitaGLIPtin (JANUVIA) 100 MG tablet Take 100 mg by mouth daily.   Yes Historical Provider, MD   No Known Allergies  FAMILY HISTORY:  has no family status information on file.  SOCIAL HISTORY:  reports that he has been smoking Cigarettes.  He has been smoking about 0.25 packs per day. He has never used smokeless tobacco. He reports that he drinks alcohol. He reports that he  does not use illicit drugs.  REVIEW OF SYSTEMS:  Unable to obtain secondary to patient condition.   SUBJECTIVE:   VITAL SIGNS: Temp:  [94.3 F (34.6 C)] 94.3 F (34.6 C) (07/02 1748) Pulse Rate:  [51-77] 77 (07/02 2101) Resp:  [19-30] 30 (07/02 2101) BP: (64-128)/(29-82) 128/36 mmHg (07/02 2101) SpO2:  [99 %-100 %] 100 % (07/02 2101) HEMODYNAMICS:   VENTILATOR SETTINGS:   INTAKE /  OUTPUT:  Intake/Output Summary (Last 24 hours) at 03/06/2015 2134 Last data filed at 02/12/2015 2110  Gross per 24 hour  Intake      0 ml  Output     25 ml  Net    -25 ml    PHYSICAL EXAMINATION: General:  Elderly Male, aggitated and combative Neuro:  Confused, tracks but does not follow commands HEENT:  Sclera anicteric, conjunctiva pale Cardiovascular:  Tachycardic, RR, NS1/S2 Lungs:  Limited exam 2/2 poor cooperation Abdomen:  S/NT/ND/(+)BS Musculoskeletal:  (-) C/C/E Skin:  Diffuse fungal infection of groin and buttocks  LABS:  CBC  Recent Labs Lab 02/19/2015 1809  WBC 10.8*  HGB 12.7*  HCT 38.0*  PLT 237   Coag's No results for input(s): APTT, INR in the last 168 hours. BMET  Recent Labs Lab 03/04/2015 1809  NA 139  K >7.5*  CL 113*  CO2 9*  BUN 204*  CREATININE 17.34*  GLUCOSE 106*   Electrolytes  Recent Labs Lab 02/16/2015 1809  CALCIUM 8.8*   Sepsis Markers  Recent Labs Lab 02/18/2015 1809  LATICACIDVEN 1.6   ABG No results for input(s): PHART, PCO2ART, PO2ART in the last 168 hours. Liver Enzymes  Recent Labs Lab 02/19/2015 1809  AST 21  ALT 23  ALKPHOS 119  BILITOT 0.4  ALBUMIN 4.0   Cardiac Enzymes No results for input(s): TROPONINI, PROBNP in the last 168 hours. Glucose  Recent Labs Lab 03/03/2015 1910  GLUCAP 94    Imaging Dg Chest 2 View  02/20/2015   CLINICAL DATA:  Altered mental status.  EXAM: CHEST  2 VIEW  COMPARISON:  11/13/2013  FINDINGS: Both lateral views are essentially nondiagnostic secondary to patient arm position. Prior median sternotomy. Patient rotated to the right on the frontal. Midline trachea. Mild cardiomegaly, accentuated by low lung volumes. No pleural effusion or pneumothorax. No congestive failure. Subsegmental atelectasis at both lung bases.  IMPRESSION: Cardiomegaly and low lung volumes, without acute disease.  Severely degraded, essentially nondiagnostic lateral views.   Electronically Signed   By: Jeronimo Greaves M.D.   On: 02/15/2015 18:34     ASSESSMENT / PLAN:  PULMONARY OETT Placement Pending A: Planned intubation for AMS P:   Lung Protective Ventilation VAP Prevention SUA/SBT  CARDIOVASCULAR CVL Placement Pending A:  RO Sepsis Hypotension CAD: HTN: P:  Continue IVF Serial Trops/Lactates Cont Asa Hold Home Antihypertensives  RENAL A:   Presumed Acute on Chronic RF: ? If 2/2 Ace vs sepsis.  Hyperkalemia: Peaked T-waves present. S/p temporizing medications. Metabolic Acidosis P:   Plan for insertion of HD catheter as soon as intubated CVVHD  GASTROINTESTINAL A:   No acute issues P:   Hold TFs as likely to be able to extubate tomorrow  HEMATOLOGIC A:   AOCD: At baseline  P:  Monitor  INFECTIOUS A:   RO Pneumonia P:   BCx2, 7/2 UC, 7/2 Sputum, 7/2 Abx: Vanc/Zosyn, start date 7/2, day 1/?  ENDOCRINE A:   DM   Hypothyroidism P:   SSI Check A1c Hold oral hypoglycemic agents Check  TSH/Thyroid Panel given AMS Cont OP meds  NEUROLOGIC A:   AMS: Most likely metabolic in origin P:   RASS goal: 0 Monitor Minimize sedation   FAMILY  - Updates: Brother updated by Dr. Curt BearsBelanger 7/2  - Inter-disciplinary family meet or Palliative Care meeting due by:  7/9     TODAY'S SUMMARY:   CRITICAL CARE: The patient is critically ill with multiple organ systems failure and requires high complexity decision making for assessment and support, frequent evaluation and titration of therapies, application of advanced monitoring technologies and extensive interpretation of multiple databases. Critical Care Time devoted to patient care services described in this note is 60 minutes.   Evalyn CascoAdam Mauria Asquith, MD Pulmonary and Critical Care Medicine [facility] Pager: [phone]  03/07/2015, 9:34 PM

## 2015-02-13 NOTE — ED Notes (Signed)
Intensivist MD at bedside. 

## 2015-02-13 NOTE — Consult Note (Signed)
Renal Service Consult Note [facility]  James BushyDonald [name] 03/02/2015 James [name] D Requesting Physician:  Dr James PaxBeaton ED  Reason for Consult:  REnal failure, high K HPI: The patient is a 80 y.o. year-old with hx of DM, HTN , CAD/ CABG presented to ED brought from ALF found confused, acting strange. BS 80. Brought to ED where K >7.5, BUN 204, Creat 17.34, CO2 9, WBC 10k, Hb 12, plt 237.  Temp 94.3 rectal. EKG showed NSR with IVCD 148 msec, no peaked t's . Old lEKG showed QRS 108 msec, NSR.   Patient confusfed and agitated. Does not provide any history.    Chart review: 1/07 - chest pain, underwent successful PCI w cutting balloon angioplasty of the vein graft to the obtuse marginal.  eF 64%.  5/09 - acute cholecystitis, underwent lap chole, anemia d/t ABL, DM2, AMS, hx cabg 2002 3/14 - admitted w aphasia, low BS 37. Work up negative for CVA (neg MRI, carotid dopplers and echo). AMS thought medication side effect from Amaryl.   Past Medical History  Past Medical History  Diagnosis Date  . DM (diabetes mellitus)   . Other and unspecified hyperlipidemia   . HTN (hypertension)   . CAD (coronary artery disease)   . Unspecified hypothyroidism   . Food impaction of esophagus 11/13/2013  . Barrett's esophagus 12/11/2013    12/04/13 - 1 cm tongue no dysplasia - consider repeating an EGd in 3-5 years   Past Surgical History  Past Surgical History  Procedure Laterality Date  . Angioplasty  08/2006    Dr.Hochrein  . Colonoscopy w/ polypectomy  2005  . Coronary artery bypass graft  1993  . Tonsillectomy    . Foot surgery      Multiple surgeries for congenital deformities   . Cholecystectomy    . Esophagogastroduodenoscopy N/A 11/13/2013    Procedure: ESOPHAGOGASTRODUODENOSCOPY (EGD);  Surgeon: James Booparl E Gessner, MD;  [facility];  Service: Endoscopy;  Laterality: N/A;   Family History  Family History  Problem Relation Age of Onset  . Macular degeneration Father   .  Coronary artery disease Father     CABG X 2  . Stroke Father   . Alzheimer's disease Mother   . Diabetes Brother     (+/-) "borderline  . Diabetes Maternal Aunt     X 2  . Diabetes Maternal Grandmother    Social History  reports that he has been smoking Cigarettes.  He has been smoking about 0.25 packs per day. He has never used smokeless tobacco. He reports that he drinks alcohol. He reports that he does not use illicit drugs. Allergies No Known Allergies Home medications Prior to Admission medications   Medication Sig Start Date End Date Taking? Authorizing Provider  albuterol (PROVENTIL HFA;VENTOLIN HFA) 108 (90 BASE) MCG/ACT inhaler Inhale 2 puffs into the lungs every 4 (four) hours as needed for shortness of breath (cough).   Yes Historical Provider, MD  aspirin EC 81 MG tablet Take 81 mg by mouth daily.   Yes Historical Provider, MD  atenolol (TENORMIN) 50 MG tablet Take 1 tablet (50 mg total) by mouth daily. 09/18/12  Yes Pecola LawlessWilliam F Hopper, MD  benazepril (LOTENSIN) 20 MG tablet Take 10 mg by mouth daily.    Yes Historical Provider, MD  benazepril-hydrochlorthiazide (LOTENSIN HCT) 20-25 MG per tablet Take 1 tablet by mouth daily.  01/29/15  Yes Historical Provider, MD  fluorouracil (EFUDEX) 5 % cream Apply 1 application topically 2 (two) times daily. To  scalp front and center for psoriasis 02/11/15  Yes Historical Provider, MD  fluticasone (FLONASE) 50 MCG/ACT nasal spray Place 1 spray into both nostrils at bedtime.  01/26/15  Yes Historical Provider, MD  insulin aspart (NOVOLOG) 100 UNIT/ML injection Inject 10 Units into the skin 3 (three) times daily before meals.    Yes Historical Provider, MD  insulin detemir (LEVEMIR) 100 UNIT/ML injection Inject 30 Units into the skin at bedtime.    Yes Historical Provider, MD  levothyroxine (SYNTHROID, LEVOTHROID) 125 MCG tablet Take 125 mcg by mouth at bedtime.  01/27/15  Yes Historical Provider, MD  metFORMIN (GLUCOPHAGE) 500 MG tablet Take 500 mg  by mouth 2 (two) times daily with a meal.  02/11/15  Yes Historical Provider, MD  omeprazole (PRILOSEC) 40 MG capsule Take 1 capsule (40 mg total) by mouth daily before breakfast. 11/13/13  Yes James Boop, MD  simvastatin (ZOCOR) 20 MG tablet Take 30 mg by mouth daily at 6 PM.    Yes Historical Provider, MD  sitaGLIPtin (JANUVIA) 100 MG tablet Take 100 mg by mouth daily.   Yes Historical Provider, MD   Liver Function Tests  Recent Labs Lab 2015/03/01 1809  AST 21  ALT 23  ALKPHOS 119  BILITOT 0.4  PROT 7.1  ALBUMIN 4.0   No results for input(s): LIPASE, AMYLASE in the last 168 hours. CBC  Recent Labs Lab 03-01-15 1809  WBC 10.8*  NEUTROABS 8.0*  HGB 12.7*  HCT 38.0*  MCV 86.8  PLT 237   Basic Metabolic Panel  Recent Labs Lab 2015-03-01 1809  NA 139  K >7.5*  CL 113*  CO2 9*  GLUCOSE 106*  BUN 204*  CREATININE 17.34*  CALCIUM 8.8*    Filed Vitals:   03/01/15 1719 March 01, 2015 1748 01-Mar-2015 1756 03-01-15 2035  BP:   108/55   Pulse:   55   Temp:  94.3 F (34.6 C)    TempSrc:  Rectal    Resp:   24   SpO2: 100%  100% 100%   Exam Confused, agitated No rash, cyanosis or gangrene Sclera anicteric, throat clear and very dry Flat neck veins Chest clear bilat RRR no MRG heard;sternotomy scar Abd soft ntnd +BS no mass or ascites GU foley in place draining cloudy yellow urine No LE or UE edema, deformity of left foot Neuro is nf, good strength x 4  UA protein 100, 0-2 wbc/ rbc CXR is clear no acute infiltrates  Assessment: 1. Renal failure - severe, not clear acute vs chronic. Probably acute related to vol depletion and ACE inhibitor. Is on thiazide diuretics and benazepril.  Foley placed w some UOP but not great. Would consider HD acutely but patient is too agitated right now for a procedure/ line placement and EKG is not significantly abnormal with regards to hyperkalemia effects. Hemodynamically stable.  Would favor conservative rx with IVF, kayexelate enemas for  now. See if IVF's and foley catheterization result in improved renal function.  2. Hypotension - better after IVF's.   3. Metabolic acidosis - renal failure +/- metformin  4. AMS/ hypothermia / vol depletion on exam - possibly infected. UA not bad and CXR negative and abd benign. Per primary.  5. Hx CAD/ CABG 6. DM2 7. Hx HTN   Plan- will be impossible to dialyze this patient in agitated state, will d/w ED / primary MD's  Vinson Moselle MD (pgr) [phone]    [phone] 2015/03/01, 8:50 PM

## 2015-02-13 NOTE — Progress Notes (Addendum)
ANTIBIOTIC CONSULT NOTE - INITIAL  Pharmacy Consult for vanc/zosyn Indication: rule out sepsis  No Known Allergies  Patient Measurements:   Adjusted Body Weight:   Vital Signs: Temp: 94.3 F (34.6 C) (07/02 1748) Temp Source: Rectal (07/02 1748) BP: 128/36 mmHg (07/02 2101) Pulse Rate: 77 (07/02 2101) Intake/Output from previous day:   Intake/Output from this shift: Total I/O In: -  Out: 25 [Urine:25]  Labs:  Recent Labs  02/18/2015 1809  WBC 10.8*  HGB 12.7*  PLT 237  CREATININE 17.34*   CrCl cannot be calculated (Unknown ideal weight.). No results for input(s): VANCOTROUGH, VANCOPEAK, VANCORANDOM, GENTTROUGH, GENTPEAK, GENTRANDOM, TOBRATROUGH, TOBRAPEAK, TOBRARND, AMIKACINPEAK, AMIKACINTROU, AMIKACIN in the last 72 hours.   Microbiology: No results found for this or any previous visit (from the past 720 hour(s)).  Medical History: Past Medical History  Diagnosis Date  . DM (diabetes mellitus)   . Other and unspecified hyperlipidemia   . HTN (hypertension)   . CAD (coronary artery disease)   . Unspecified hypothyroidism   . Food impaction of esophagus 11/13/2013  . Barrett's esophagus 12/11/2013    12/04/13 - 1 cm tongue no dysplasia - consider repeating an EGd in 3-5 years    Medications:  Scheduled:  . aspirin EC  81 mg Oral Daily  . heparin  5,000 Units Subcutaneous 3 times per day  . [START ON 02/19/2015] insulin aspart  1-3 Units Subcutaneous 6 times per day  . levothyroxine  125 mcg Oral QHS  . pantoprazole  40 mg Oral Daily  . [START ON 02/15/2015] simvastatin  30 mg Oral q1800   Infusions:  . sodium chloride    . albuterol 10 mg/hr (02/15/2015 2035)  . dialysis replacement fluid (prismasate)    . dialysis replacement fluid (prismasate)    . dialysate (PRISMASATE)     Assessment: 80 yo with MMP presented with AMS. Found to be in severe ARF. Nephrology was consulted and the plan is to do CRRT. Vanc/zosyn have been ordered for sepsis. He got a vanc load  in the ED tonight.    Goal of Therapy:  Vancomycin trough level 15-20 mcg/ml  Plan:   Vanc 1.5g IV x1  F/u with wt for addition dosing Zosyn 3.375g IV q8  Ulyses SouthwardMinh Pham, PharmD Pager: [phone] 03/01/2015 10:15 PM     ADDENDUM: Will continue vancomycin with 750mg  IV Q24H and monitor CBC, Cx, levels prn. Vernard GamblesVeronda Marena Witts, PharmD, BCPS 02/16/2015 2:41 AM

## 2015-02-13 NOTE — ED Notes (Signed)
At Dr. Corky SingBeaton's request this nurse called RT to ask that the neb treatment be started ASAP.

## 2015-02-13 NOTE — ED Notes (Signed)
[facility] pt via EMS [facility] with c/o LSN yesterday. Pt seen by staff sometime this morning as being not himself. CBG for EMS 80, pt given instaglucose by EMS CBG increased to 124. Pt incontinent of stool and urine. Pt with redness to B/L groin with tenderness and redness to sacral/buttock area.

## 2015-02-13 NOTE — ED Provider Notes (Signed)
CSN: [id number]     Arrival date & time 03/12/2015  1713 History   First MD Initiated Contact with Patient March 04, 2015 1714     Chief Complaint  Patient presents with  . Altered Mental Status   (Consider location/radiation/quality/duration/timing/severity/associated sxs/prior Treatment) Patient is a 80 y.o. male presenting with altered mental status. The history is provided by the EMS personnel and the patient. The history is limited by the condition of the patient.  Altered Mental Status Presenting symptoms: behavior changes, confusion and disorientation   Severity:  Moderate Most recent episode:  Today Episode history:  Single Timing:  Constant Progression:  Worsening Chronicity:  New Context: nursing home resident (assisted living facility)   Context: not dementia, not head injury and not a recent change in medication   Associated symptoms: rash (bilateral inginal candidiasis)   Associated symptoms: no abdominal pain, no difficulty breathing and no vomiting   Associated symptoms comment:  Bowel incontinence    Past Medical History  Diagnosis Date  . DM (diabetes mellitus)   . Other and unspecified hyperlipidemia   . HTN (hypertension)   . CAD (coronary artery disease)   . Unspecified hypothyroidism   . Food impaction of esophagus 11/13/2013  . Barrett's esophagus 12/11/2013    12/04/13 - 1 cm tongue no dysplasia - consider repeating an EGd in 3-5 years   Past Surgical History  Procedure Laterality Date  . Angioplasty  08/2006    Dr.Hochrein  . Colonoscopy w/ polypectomy  2005  . Coronary artery bypass graft  1993  . Tonsillectomy    . Foot surgery      Multiple surgeries for congenital deformities   . Cholecystectomy    . Esophagogastroduodenoscopy N/A 11/13/2013    Procedure: ESOPHAGOGASTRODUODENOSCOPY (EGD);  Surgeon: Iva Booparl E Gessner, MD;  Location: [facility];  Service: Endoscopy;  Laterality: N/A;   Family History  Problem Relation Age of Onset  . Macular degeneration  Father   . Coronary artery disease Father     CABG X 2  . Stroke Father   . Alzheimer's disease Mother   . Diabetes Brother     (+/-) "borderline  . Diabetes Maternal Aunt     X 2  . Diabetes Maternal Grandmother    History  Substance Use Topics  . Smoking status: Current Every Day Smoker -- 0.25 packs/day    Types: Cigarettes  . Smokeless tobacco: Never Used     Comment: Age 80 , smokes about 6 cigs/ daily   . Alcohol Use: Yes     Comment: Occasionally     Review of Systems  Unable to perform ROS: Mental status change  Gastrointestinal: Positive for diarrhea. Negative for vomiting and abdominal pain.  Skin: Positive for rash (bilateral inginal candidiasis).  Psychiatric/Behavioral: Positive for confusion.      Allergies  Review of patient's allergies indicates no known allergies.  Home Medications   Prior to Admission medications   Medication Sig Start Date End Date Taking? Authorizing Provider  albuterol (PROVENTIL HFA;VENTOLIN HFA) 108 (90 BASE) MCG/ACT inhaler Inhale 2 puffs into the lungs every 4 (four) hours as needed for shortness of breath (cough).   Yes Historical Provider, MD  aspirin EC 81 MG tablet Take 81 mg by mouth daily.   Yes Historical Provider, MD  atenolol (TENORMIN) 50 MG tablet Take 1 tablet (50 mg total) by mouth daily. 09/18/12  Yes Pecola LawlessWilliam F Hopper, MD  benazepril (LOTENSIN) 20 MG tablet Take 10 mg by mouth daily.  Yes Historical Provider, MD  benazepril-hydrochlorthiazide (LOTENSIN HCT) 20-25 MG per tablet Take 1 tablet by mouth daily.  01/29/15  Yes Historical Provider, MD  fluorouracil (EFUDEX) 5 % cream Apply 1 application topically 2 (two) times daily. To scalp front and center for psoriasis 02/11/15  Yes Historical Provider, MD  fluticasone (FLONASE) 50 MCG/ACT nasal spray Place 1 spray into both nostrils at bedtime.  01/26/15  Yes Historical Provider, MD  insulin aspart (NOVOLOG) 100 UNIT/ML injection Inject 10 Units into the skin 3 (three)  times daily before meals.    Yes Historical Provider, MD  insulin detemir (LEVEMIR) 100 UNIT/ML injection Inject 30 Units into the skin at bedtime.    Yes Historical Provider, MD  levothyroxine (SYNTHROID, LEVOTHROID) 125 MCG tablet Take 125 mcg by mouth at bedtime.  01/27/15  Yes Historical Provider, MD  metFORMIN (GLUCOPHAGE) 500 MG tablet Take 500 mg by mouth 2 (two) times daily with a meal.  02/11/15  Yes Historical Provider, MD  omeprazole (PRILOSEC) 40 MG capsule Take 1 capsule (40 mg total) by mouth daily before breakfast. 11/13/13  Yes Iva Boop, MD  simvastatin (ZOCOR) 20 MG tablet Take 30 mg by mouth daily at 6 PM.    Yes Historical Provider, MD  sitaGLIPtin (JANUVIA) 100 MG tablet Take 100 mg by mouth daily.   Yes Historical Provider, MD   ED Triage Vitals  Enc Vitals Group     BP 02/22/2015 1745 80/45 mmHg     Pulse Rate 02/22/2015 1745 56     Resp 03/04/2015 1745 24     Temp 02/27/2015 1748 94.3 F (34.6 C)     Temp Source 02/12/2015 1748 Rectal     SpO2 02/24/2015 1719 100 %     Weight 02-16-2015 0000 191 lb 5.8 oz (86.8 kg)     Height 02/27/2015 2300 6' (1.829 m)     Head Cir --      Peak Flow --      Pain Score --      Pain Loc --      Pain Edu? --      Excl. in GC? --     Physical Exam  Constitutional: He appears well-developed and well-nourished. He is cooperative. He appears ill. No distress.  HENT:  Head: Normocephalic and atraumatic.  Nose: Nose normal.  Mouth/Throat: Oropharynx is clear and moist. No oropharyngeal exudate.  Eyes: EOM are normal. Pupils are equal, round, and reactive to light.  Neck: Normal range of motion. Neck supple.  Cardiovascular: Normal rate, regular rhythm, normal heart sounds and intact distal pulses.   No murmur heard. Hypotensive, bradycardic 2+ bilaterally symmetric palpable distal pulses  Pulmonary/Chest: Effort normal and breath sounds normal. No respiratory distress. He has no wheezes. He exhibits no tenderness.  Normal work of breathing,   Good O2 sats on room air  Abdominal: Soft. He exhibits no distension. There is no tenderness. There is no guarding.  Genitourinary:     Loose stool on bilateral lower extremities and buttocks. Large but superficial, stage 1 pressure ulcer on buttocks/sacrum   Musculoskeletal: Normal range of motion. He exhibits no tenderness.  Neurological: He is alert. He has normal strength. He is disoriented. No cranial nerve deficit. Coordination normal. GCS eye subscore is 4. GCS verbal subscore is 5. GCS motor subscore is 6.  Oriented to name only.  Awake and alert.  Cooperative and responding to commands appropriately  Skin: Skin is warm and dry. He is not diaphoretic. No pallor.  Psychiatric: He has a normal mood and affect. His behavior is normal. Judgment and thought content normal. Cognition and memory are impaired.  Nursing note and vitals reviewed.   ED Course  Procedures (including critical care time) Labs Review Labs Reviewed  CBC WITH DIFFERENTIAL/PLATELET - Abnormal; Notable for the following:    WBC 10.8 (*)    Hemoglobin 12.7 (*)    HCT 38.0 (*)    RDW 16.5 (*)    Neutro Abs 8.0 (*)    All other components within normal limits  COMPREHENSIVE METABOLIC PANEL - Abnormal; Notable for the following:    Potassium >7.5 (*)    Chloride 113 (*)    CO2 9 (*)    Glucose, Bld 106 (*)    BUN 204 (*)    Creatinine, Ser 17.34 (*)    Calcium 8.8 (*)    GFR calc non Af Amer 2 (*)    GFR calc Af Amer 3 (*)    Anion gap 17 (*)    All other components within normal limits  URINALYSIS, ROUTINE W REFLEX MICROSCOPIC (NOT AT [facility]) - Abnormal; Notable for the following:    Color, Urine AMBER (*)    APPearance CLOUDY (*)    Hgb urine dipstick LARGE (*)    Bilirubin Urine SMALL (*)    Ketones, ur 15 (*)    Protein, ur 100 (*)    Leukocytes, UA TRACE (*)    All other components within normal limits  AMMONIA - Abnormal; Notable for the following:    Ammonia 54 (*)    All other components  within normal limits  URINE CULTURE  CULTURE, BLOOD (ROUTINE X 2)  CULTURE, BLOOD (ROUTINE X 2)  LACTIC ACID, PLASMA  TSH  T4, FREE  URINE MICROSCOPIC-ADD ON  MAGNESIUM  PHOSPHORUS  CK  CBG MONITORING, ED  Rosezena Sensor, ED    Imaging Review Dg Chest 2 View  03/11/2015   CLINICAL DATA:  Altered mental status.  EXAM: CHEST  2 VIEW  COMPARISON:  11/13/2013  FINDINGS: Both lateral views are essentially nondiagnostic secondary to patient arm position. Prior median sternotomy. Patient rotated to the right on the frontal. Midline trachea. Mild cardiomegaly, accentuated by low lung volumes. No pleural effusion or pneumothorax. No congestive failure. Subsegmental atelectasis at both lung bases.  IMPRESSION: Cardiomegaly and low lung volumes, without acute disease.  Severely degraded, essentially nondiagnostic lateral views.   Electronically Signed   By: Jeronimo Greaves M.D.   On: 2015-03-11 18:34     EKG Interpretation   Date/Time:  Saturday 03/11/2015 17:39:55 EDT Ventricular Rate:  55 PR Interval:  171 QRS Duration: 145 QT Interval:  531 QTC Calculation: 508 R Axis:   77 Text Interpretation:  Sinus rhythm Atrial premature complex Nonspecific  intraventricular conduction delay Abnormal ekg Confirmed by BEATON  MD,  ROBERT ([id number]) on 03/11/2015 6:08:34 PM      MDM   Final diagnoses:  Acute Kidney Injury Confusion Sepsis from unknown source Hypotension Hypothermia Hyperkalemia   Pt is a 80 yo M with hx of DM, HTN, CAD, hypothyroidism, and congenital foot deformities who presents from an assisted living facility 2/2 AMS.  Lives in [facility] assisted living facility and is reportedly usually able to keep his house clean and takes care of himself primarily.  Last seen normal yesterday.  Today the nurse found him confused and smelling of feces.  She was concerned, so called EMS.  Glucose wnl but soft BP and altered mental  status en route.     Presented to [facility] ED and was awake and  pleasant but disoriented, only able to provide his name.  Hypothermic to 94.  Soft but stable BP initially, then dropped down to 80s/50s.  Bradycardic in the 50s.  Had stool over his lower extremities, significant bilateral inguinal candidiasis, and stage 1 sacral ulcer.  Normal work of breathing, no hypoxia, and clear lungs.    Blood and urine cultures were ordered.  He was started on IVF bolus and empirically covered with vanc and zosyn.  Placed on a bear hugger for temp regulation.   CXR with no opacities, previous sternotomy.    Patient began gradually becoming more altered and agitated.  No longer following commands appropriately.     Labs show significant AKI.  No labs in our system for the past year, but previously creatinine was 1.4 and today he had BUN 204 and Cr 17.34.  Potassium over 7.5 with no hemolysis.  EKG with mildly peaked T waves.  Provided with cardioprotection with calcium gluconate, insulin, glucose, and albuterol nebulizer for profound hyperkalemia.     Spoke to nephrology (Dr. Arlean Hopping) and critical care Consults believe that his AKI may be secondary to ACE inhibitor use and unclear if it is acute or chronic.  He is severely ill and will be taken to the ICU by critical care.  To get lines placed and continue to be stabilized, then plan for HD after.    Redosed calcium in the ED prior to transfer.    Labs, EKGs, and imaging were reviewed and interpreted by myself and my attending, and incorporated in the medical decision making.  Patient was seen with ED Attending, Dr. Sandi Mealy, MD   Lenell Antu, MD 02/16/2015 1610  Nelva Nay, MD 02/28/15 [phone]

## 2015-02-13 NOTE — Progress Notes (Signed)
Have d/w CCM, plan is to have pt intubated , place line and start on CRRT.  Orders written.   Vinson Moselleob Verne Lanuza MD (pgr) [phone]    ([id number]) [phone] 03/04/2015, 9:34 PM

## 2015-02-13 NOTE — ED Notes (Signed)
This nurse called RT to ask that neb be started ASAP.

## 2015-02-13 NOTE — ED Notes (Signed)
Nephrologist MD at bedside

## 2015-02-13 NOTE — ED Notes (Signed)
MD at bedside. 

## 2015-02-13 NOTE — ED Notes (Signed)
Zoll pads placed on pt.

## 2015-02-13 NOTE — ED Notes (Signed)
Per lab critical value:  potassium 7.5.  MD made aware

## 2015-02-13 NOTE — Procedures (Signed)
Intubation Procedure Note James BushyDonald [name] [id number] 03-27-1935  Procedure: Intubation Indications: AMS and need for invasive procedure  Procedure Details Consent: Unable to obtain consent because of altered level of consciousness. Time Out: Verified patient identification, verified procedure, site/side was marked, verified correct patient position, special equipment/implants available, medications/allergies/relevent history reviewed, required imaging and test results available.  Performed  Maximum sterile technique was used including gloves and mask.   Sedated with 4 mg Versed, 100 mcg Fentanyl Easy mask ventilation Paralyzed with 40 mg Rocuronium Intubated using Glidescope 3 Grade 1 view ETT secured at 26 cm at lip Placement confirmed by bilateral breath sounds and (+) color change  Evaluation Hemodynamic Status: BP stable throughout but patient required titration of pressures started prior to procedure; O2 sats: stable throughout Patient's Current Condition: stable Complications: No apparent complications Patient did tolerate procedure well. Chest X-ray ordered to verify placement.  CXR: pending.   James [name] R. 02/27/2015

## 2015-02-13 NOTE — Procedures (Signed)
Central Venous Catheter Insertion Procedure Note Grace BushyDonald Wurzel [id number] 05/05/1935  Procedure: Insertion of Central Venous Catheter Indications: need for urgent HD  Procedure Details Consent: Unable to obtain consent because of altered level of consciousness. Time Out: Verified patient identification, verified procedure, site/side was marked, verified correct patient position, special equipment/implants available, medications/allergies/relevent history reviewed, required imaging and test results available.  Performed  Maximum sterile technique was used including antiseptics, cap, gloves, gown, hand hygiene, mask and sheet. Skin prep: Chlorhexidine; local anesthetic administered A antimicrobial bonded/coated Trialysis catheter was placed in the left internal jugular vein using the Seldinger technique.  Evaluation Blood flow good Complications: No apparent complications Patient did tolerate procedure well. Chest X-ray ordered to verify placement.  CXR: pending.  Claude Waldman R. 02/15/2015, 11:41 PM  I used ultrasound to locate and access the vein/artery.

## 2015-02-14 ENCOUNTER — Inpatient Hospital Stay (HOSPITAL_COMMUNITY): Payer: Medicare Other

## 2015-02-14 DIAGNOSIS — Z66 Do not resuscitate: Secondary | ICD-10-CM

## 2015-02-14 DIAGNOSIS — I469 Cardiac arrest, cause unspecified: Secondary | ICD-10-CM

## 2015-02-14 LAB — URINALYSIS, ROUTINE W REFLEX MICROSCOPIC
Glucose, UA: NEGATIVE mg/dL
Ketones, ur: 15 mg/dL — AB
NITRITE: NEGATIVE
PROTEIN: 100 mg/dL — AB
Specific Gravity, Urine: 1.019 (ref 1.005–1.030)
UROBILINOGEN UA: 0.2 mg/dL (ref 0.0–1.0)
pH: 5 (ref 5.0–8.0)

## 2015-02-14 LAB — CBC
HEMATOCRIT: 29.4 % — AB (ref 39.0–52.0)
HEMOGLOBIN: 10 g/dL — AB (ref 13.0–17.0)
MCH: 28.9 pg (ref 26.0–34.0)
MCHC: 34 g/dL (ref 30.0–36.0)
MCV: 85 fL (ref 78.0–100.0)
Platelets: 103 10*3/uL — ABNORMAL LOW (ref 150–400)
RBC: 3.46 MIL/uL — ABNORMAL LOW (ref 4.22–5.81)
RDW: 16.9 % — AB (ref 11.5–15.5)
WBC: 10.1 10*3/uL (ref 4.0–10.5)

## 2015-02-14 LAB — BLOOD GAS, ARTERIAL
ACID-BASE DEFICIT: 24.9 mmol/L — AB (ref 0.0–2.0)
BICARBONATE: 4.9 meq/L — AB (ref 20.0–24.0)
DRAWN BY: 36989
FIO2: 1 %
MECHVT: 470 mL
O2 Saturation: 98.5 %
PEEP: 5 cmH2O
Patient temperature: 98.6
RATE: 24 resp/min
TCO2: 5.6 mmol/L (ref 0–100)
pCO2 arterial: 24.6 mmHg — ABNORMAL LOW (ref 35.0–45.0)
pH, Arterial: 6.928 — CL (ref 7.350–7.450)
pO2, Arterial: 298 mmHg — ABNORMAL HIGH (ref 80.0–100.0)

## 2015-02-14 LAB — APTT: aPTT: >200 seconds (ref 24–37)

## 2015-02-14 LAB — RENAL FUNCTION PANEL
ALBUMIN: 1.6 g/dL — AB (ref 3.5–5.0)
ANION GAP: 23 — AB (ref 5–15)
BUN: 172 mg/dL — ABNORMAL HIGH (ref 6–20)
CALCIUM: 11.7 mg/dL — AB (ref 8.9–10.3)
CHLORIDE: 113 mmol/L — AB (ref 101–111)
CO2: 23 mmol/L (ref 22–32)
CREATININE: 13.15 mg/dL — AB (ref 0.61–1.24)
GFR calc Af Amer: 4 mL/min — ABNORMAL LOW (ref >60)
GFR, EST NON AFRICAN AMERICAN: 3 mL/min — AB (ref >60)
GLUCOSE: 344 mg/dL — AB (ref 65–99)
Phosphorus: 8.3 mg/dL — ABNORMAL HIGH (ref 2.5–4.6)
Potassium: 4.6 mmol/L (ref 3.5–5.1)
Sodium: 159 mmol/L — ABNORMAL HIGH (ref 135–145)

## 2015-02-14 LAB — HEPATIC FUNCTION PANEL
ALT: 22 U/L (ref 17–63)
AST: 58 U/L — ABNORMAL HIGH (ref 15–41)
Albumin: 1.6 g/dL — ABNORMAL LOW (ref 3.5–5.0)
Alkaline Phosphatase: 82 U/L (ref 38–126)
BILIRUBIN INDIRECT: 0.3 mg/dL (ref 0.3–0.9)
BILIRUBIN TOTAL: 0.7 mg/dL (ref 0.3–1.2)
Bilirubin, Direct: 0.4 mg/dL (ref 0.1–0.5)
TOTAL PROTEIN: 3.2 g/dL — AB (ref 6.5–8.1)

## 2015-02-14 LAB — POCT I-STAT 3, ART BLOOD GAS (G3+)
Acid-base deficit: 2 mmol/L (ref 0.0–2.0)
BICARBONATE: 25 meq/L — AB (ref 20.0–24.0)
O2 SAT: 82 %
PCO2 ART: 52.6 mmHg — AB (ref 35.0–45.0)
TCO2: 27 mmol/L (ref 0–100)
pH, Arterial: 7.286 — ABNORMAL LOW (ref 7.350–7.450)
pO2, Arterial: 52 mmHg — ABNORMAL LOW (ref 80.0–100.0)

## 2015-02-14 LAB — URINE MICROSCOPIC-ADD ON

## 2015-02-14 LAB — PROTIME-INR
INR: >10 (ref 0.00–1.49)
Prothrombin Time: >90 seconds — ABNORMAL HIGH (ref 11.6–15.2)

## 2015-02-14 LAB — TROPONIN I
TROPONIN I: 0.2 ng/mL — AB (ref <0.031)
Troponin I: 6.49 ng/mL (ref <0.031)
Troponin I: 6.52 ng/mL (ref <0.031)

## 2015-02-14 LAB — PHOSPHORUS: Phosphorus: 7.6 mg/dL — ABNORMAL HIGH (ref 2.5–4.6)

## 2015-02-14 LAB — BASIC METABOLIC PANEL
ANION GAP: 22 — AB (ref 5–15)
BUN: 171 mg/dL — ABNORMAL HIGH (ref 6–20)
CHLORIDE: 114 mmol/L — AB (ref 101–111)
CO2: 23 mmol/L (ref 22–32)
Calcium: 11.6 mg/dL — ABNORMAL HIGH (ref 8.9–10.3)
Creatinine, Ser: 13.41 mg/dL — ABNORMAL HIGH (ref 0.61–1.24)
GFR calc non Af Amer: 3 mL/min — ABNORMAL LOW (ref >60)
GFR, EST AFRICAN AMERICAN: 4 mL/min — AB (ref >60)
GLUCOSE: 343 mg/dL — AB (ref 65–99)
Potassium: 4.7 mmol/L (ref 3.5–5.1)
Sodium: 159 mmol/L — ABNORMAL HIGH (ref 135–145)

## 2015-02-14 LAB — MAGNESIUM
MAGNESIUM: 1.8 mg/dL (ref 1.7–2.4)
Magnesium: 1.1 mg/dL — ABNORMAL LOW (ref 1.7–2.4)

## 2015-02-14 LAB — LACTIC ACID, PLASMA
Lactic Acid, Venous: 1.5 mmol/L (ref 0.5–2.0)
Lactic Acid, Venous: 8.6 mmol/L (ref 0.5–2.0)

## 2015-02-14 LAB — CK: Total CK: 97 U/L (ref 49–397)

## 2015-02-14 LAB — MRSA PCR SCREENING: MRSA BY PCR: NEGATIVE

## 2015-02-14 MED ORDER — DEXTROSE 5 % IV SOLN
0.5000 ug/min | INTRAVENOUS | Status: DC
  Administered 2015-02-14: 50 ug/min via INTRAVENOUS
  Filled 2015-02-14: qty 4

## 2015-02-14 MED ORDER — SODIUM BICARBONATE 8.4 % IV SOLN
INTRAVENOUS | Status: AC
  Filled 2015-02-14: qty 100

## 2015-02-14 MED ORDER — SODIUM BICARBONATE 8.4 % IV SOLN
INTRAVENOUS | Status: AC
  Filled 2015-02-14: qty 200

## 2015-02-14 MED ORDER — FENTANYL CITRATE (PF) 100 MCG/2ML IJ SOLN
INTRAMUSCULAR | Status: AC
  Filled 2015-02-14: qty 4

## 2015-02-14 MED ORDER — STERILE WATER FOR INJECTION IV SOLN
INTRAVENOUS | Status: DC
  Administered 2015-02-14: 03:00:00 via INTRAVENOUS_CENTRAL
  Filled 2015-02-14 (×6): qty 150

## 2015-02-14 MED ORDER — SODIUM BICARBONATE 8.4 % IV SOLN
INTRAVENOUS | Status: AC
  Filled 2015-02-14: qty 150

## 2015-02-14 MED ORDER — PRISMASOL BGK 4/2.5 32-4-2.5 MEQ/L IV SOLN
INTRAVENOUS | Status: DC
  Administered 2015-02-14: 03:00:00 via INTRAVENOUS_CENTRAL
  Filled 2015-02-14 (×5): qty 5000

## 2015-02-14 MED ORDER — SODIUM BICARBONATE 8.4 % IV SOLN
INTRAVENOUS | Status: DC
  Administered 2015-02-14: 03:00:00 via INTRAVENOUS_CENTRAL
  Filled 2015-02-14 (×6): qty 150

## 2015-02-14 MED ORDER — NOREPINEPHRINE BITARTRATE 1 MG/ML IV SOLN
0.0000 ug/min | INTRAVENOUS | Status: DC
  Administered 2015-02-14: 50 ug/min via INTRAVENOUS
  Filled 2015-02-14 (×3): qty 16

## 2015-02-14 MED ORDER — MAGNESIUM SULFATE 4 GM/100ML IV SOLN
4.0000 g | Freq: Once | INTRAVENOUS | Status: DC
  Filled 2015-02-14: qty 100

## 2015-02-14 MED ORDER — ALBUTEROL SULFATE (2.5 MG/3ML) 0.083% IN NEBU
5.0000 mg | INHALATION_SOLUTION | Freq: Once | RESPIRATORY_TRACT | Status: AC
  Administered 2015-02-14: 5 mg via RESPIRATORY_TRACT

## 2015-02-14 MED ORDER — VANCOMYCIN HCL IN DEXTROSE 750-5 MG/150ML-% IV SOLN: 750.0000 mg | INTRAVENOUS | Status: DC

## 2015-02-14 MED ORDER — SODIUM CHLORIDE 0.9 % IV SOLN: INTRAVENOUS | Status: DC | PRN

## 2015-02-14 MED ORDER — ALBUTEROL SULFATE (2.5 MG/3ML) 0.083% IN NEBU
INHALATION_SOLUTION | RESPIRATORY_TRACT | Status: AC
  Filled 2015-02-14: qty 6

## 2015-02-14 MED ORDER — VASOPRESSIN 20 UNIT/ML IV SOLN
0.0300 [IU]/min | INTRAVENOUS | Status: DC
  Administered 2015-02-14: 0.03 [IU]/min via INTRAVENOUS
  Filled 2015-02-14: qty 2

## 2015-02-14 MED ORDER — MIDAZOLAM HCL 2 MG/2ML IJ SOLN
INTRAMUSCULAR | Status: AC
  Filled 2015-02-14: qty 4

## 2015-02-14 MED ORDER — SODIUM BICARBONATE 8.4 % IV SOLN
INTRAVENOUS | Status: DC
  Administered 2015-02-14: 02:00:00 via INTRAVENOUS
  Filled 2015-02-14 (×3): qty 150

## 2015-02-14 MED ORDER — MAGNESIUM SULFATE 2 GM/50ML IV SOLN
INTRAVENOUS | Status: AC
  Filled 2015-02-14: qty 100

## 2015-02-14 MED FILL — Medication: Qty: 1 | Status: AC

## 2015-02-15 LAB — URINE CULTURE
Culture: NO GROWTH
SPECIAL REQUESTS: NORMAL

## 2015-02-15 MED FILL — Medication: Qty: 1 | Status: AC

## 2015-02-16 LAB — HEMOGLOBIN A1C
Hgb A1c MFr Bld: 8.6 % — ABNORMAL HIGH (ref 4.8–5.6)
Mean Plasma Glucose: 200 mg/dL

## 2015-02-16 NOTE — Accreditation Note (Signed)
o Restraints not reported to CMS Pursuant to regulation 482.13 (G) (3) use of soft wrist restraints was logged on 07.05.2016 at 0720 by Rosine DoorAngus Jaelin Fackler, RN, Patient Safety and Accreditation.

## 2015-02-17 ENCOUNTER — Telehealth: Payer: Self-pay

## 2015-02-17 NOTE — Telephone Encounter (Signed)
On 76/2016 I received a death certificate [facility] [employer]. The death certificate is for burial. The patient is a patient of Evalyn Cascodam Belanger. On 02/18/2015 I received the death certificate back from Doctor Craige CottaSood who signed the death certificate. I got the death certificate ready for pickup and called the funeral home to let them know the death certificate was ready for pickup.

## 2015-02-18 LAB — CULTURE, BLOOD (ROUTINE X 2)
CULTURE: NO GROWTH
CULTURE: NO GROWTH

## 2015-03-15 NOTE — Progress Notes (Signed)
   07/23/2015 0300  Clinical Encounter Type  Visited With Family;Health care provider  Visit Type Initial;Code;Critical Care  Referral From Physician   Chaplain was paged at 1:40 AM to patient's room for a code blue in progress. When chaplain arrived, patient's brother and sister-in-law were present. Patient was stabilized at this time. Patient's brother said that the patient had been in living in an assisted living facility and that the patient's recent health complications have happened suddenly and have surprised him. Patient's family had no further needs at this time. Page Merrilyn Puman-Call chaplain if further support is needed late this weekend.  Cranston NeighborStrother, Shanita Kanan R, Chaplain  3:39 AM

## 2015-03-15 NOTE — Code Documentation (Signed)
Called to patient's beside x 2 for code. First code approximately 10 min in duration. Second < 3 min. Aggressive ressusitation provided.   Unclear what drove each code. Patient started CVVHD prior to first code (see Dr. Jane Canaryamaswamy's note from the first episode for details). Presumably 1st 2/2 hyperkalemia. Blood obtained following second code notable for normokalemia. The bicarb was also normal but that may have been 2/2 aggressive pushing of bicarb during the code.   Patient now with additional central line and art line. Updated problems as follows:  ASSESSMENT / PLAN:  PULMONARY OETT 4 cm from carina A: Acute hypoxic Respiratory Failure: Rapid change in X-ray and worsening oxygenation parameters suggestive of edema. ARDS possible as well. P:  Lung Protective Ventilation VAP Prevention SUA/SBT  CARDIOVASCULAR CVL: Right Subclavian CVC, 02/16/2015 and L IJ HD catheter, 03/10/2015 A:  Cardiac Arrest RO Sepsis Hypotension CAD: HTN: P:  Pressors to maintain MAP >= 65 ContSerial Trops/Lactates Cont Asa Hold Home Antihypertensives   RENAL A:  Presumed Acute on Chronic RF: ? If 2/2 Ace vs sepsis.  Hyperkalemia: Peaked T-waves present. S/p temporizing medications. Metabolic Acidosis P:  CVVHD Bicarb Drip  GASTROINTESTINAL A:  No acute issues P:  Hold TFs as likely to be able to extubate tomorrow  HEMATOLOGIC A:  AOCD: At baseline  P:  Monitor  INFECTIOUS A:  RO Pneumonia P:  BCx2, 7/2 UC, 7/2 Sputum, 7/2 Abx: Vanc/Zosyn, start date 7/2, day 1/?  ENDOCRINE A:  DM  Hypothyroidism P:  SSI Check A1c Hold oral hypoglycemic agents Check TSH/Thyroid Panel given AMS Cont OP meds  NEUROLOGIC A:  AMS: Most likely metabolic in origin P:  RASS goal: 0 Monitor Minimize sedation

## 2015-03-15 NOTE — Op Note (Addendum)
   PATIENT NAME: James BushyDonald [name] MEDICAL RECORD NUMBER: [id number] Birthday: [birth year]  Age: 80 y.o. Admit Date: 03/10/2015  Provider:  Initially Yolanda BonineAdam Bellanger who had to run to another code and then Dr Marchelle Gearingamaswamy myself who had to rush from [facility] to provide services due to multiple codes in the hospital simultaneosuly  Indication: Hyperkalemic and metabolic acidosis related -> Wide Complex with brady -> arrest  Technical Description:   CPR performance duration: 10 min - ended by time of my arrival  Was defibrillation or cardioversion used ? Shock x 2 l prior to my arriva  Was external pacer placed ? no  Was patient intubated pre/post CPR ?  Pre-cpr was intuated  Was transvenous pacer placed ? no  Medications Administered Include      Yes/no Amiodarone Yes 300mg   Atropin no  Calcium Yes x 3  Epinephrine Yes x  3  Lidocaine No  Magnesium No  Norepinephrine Was ongoing prior to arrest and had to be increased  Phenylephrine no  Sodium bicarbonate Boluses yes x 6-7 and then gtt  Vasopression no  Others d50 with insulin and albuterol and increase RR on vent to help with acidosis   Evaluation  Final Status - Was patient successfully resuscitated ?  YES   If successfully resuscitated - what is current rhythm ? Sinus If successfully resuscitated - what is current hemodynamic status ? Circulatory shock needing max levophed and needing to start both bicarb gtt  Miscellaneous Information Restart CVVH post CPR Stat labs Not a candidate for arctic sun due to refractory shock - currently comatose   Dr. Kalman ShanMurali Hayla Hinger, M.D., [facility] Center[facility].C.P Pulmonary and Critical Care Medicine Staff Physician [facility] [place] [facility] Pager: [phone], If no answer or between  15:00h - 7:00h: call [phone]  02/17/2015 1:18 AM

## 2015-03-15 NOTE — Progress Notes (Signed)
Cardiac time of death [id number]. Patient is DNR, family at bedside. No heart tones auscultated. Pupils fixed and dilated. Asystole in two leads. Support provided to family. Elink MD made aware. Raechel ChuteAdam Belenger, PCCM fellow also made aware.

## 2015-03-15 NOTE — Procedures (Signed)
Central Venous Catheter Insertion Procedure Note James BushyDonald [name] [id number] [birth year]  Procedure: Insertion of Central Venous Catheter Indications: Assessment of intravascular volume, Drug and/or fluid administration and Frequent blood sampling  Procedure Details Consent: Risks of procedure as well as the alternatives and risks of each were explained to the (patient/caregiver).  Consent for procedure obtained. Time Out: Verified patient identification, verified procedure, site/side was marked, verified correct patient position, special equipment/implants available, medications/allergies/relevent history reviewed, required imaging and test results available.  Performed  Maximum sterile technique was used including antiseptics, cap, gloves, gown, hand hygiene, mask and sheet. Skin prep: Chlorhexidine; local anesthetic administered A antimicrobial bonded/coated triple lumen catheter was placed in the right subclavian vein using the Seldinger technique.  Evaluation Blood flow good Complications: No apparent complications Patient did tolerate procedure well. Chest X-ray ordered to verify placement.  CXR: normal.  James [name]. 02/20/2015, 2:32 AM

## 2015-03-15 NOTE — Procedures (Signed)
Arterial Catheter Insertion Procedure Note James [name] [id number] 1935-05-16  Procedure: Insertion of Arterial Catheter  Indications: Blood pressure monitoring and Frequent blood sampling  Procedure Details Consent: Unable to obtain consent because of emergent medical necessity. Time Out: Verified patient identification, verified procedure, site/side was marked, verified correct patient position, special equipment/implants available, medications/allergies/relevent history reviewed, required imaging and test results available.  Performed  Maximum sterile technique was used including antiseptics, cap, gloves, gown, hand hygiene, mask and sheet. Skin prep: Chlorhexidine; local anesthetic administered 20 gauge catheter was inserted into right radial artery using the Seldinger technique.  Evaluation Blood flow good; BP tracing good. Complications: No apparent complications.   James PilgrimCOCKMAN, James [name] 03/09/2015

## 2015-03-15 NOTE — Progress Notes (Addendum)
Unfortunately, despite aggressive efforts at resuscitation, patient's vital signs continue to deteriorate. After discussion with family, decision made to make him DNR.   CRITICAL CARE: The patient is critically ill with multiple organ systems failure and requires high complexity decision making for assessment and support, frequent evaluation and titration of therapies, application of advanced monitoring technologies and extensive interpretation of multiple databases. Critical Care Time devoted to patient care services described in this note and the previous noted from 7/3 is 60 minutes, independent of the CC time provided on 7/2.

## 2015-03-15 NOTE — Progress Notes (Signed)
Notified CCM of ABG results.

## 2015-03-15 DEATH — deceased

## 2015-04-15 NOTE — Discharge Summary (Signed)
NAMECOOPER, MORONEY NO.:  [id number]  MEDICAL RECORD NO.:  [id number]  LOCATION:  2M04C                        FACILITY:  [facility]  PHYSICIAN:  Nelda Bucks, MD DATE OF BIRTH:  1935/05/06  DATE OF ADMISSION:  02/22/2015 DATE OF DISCHARGE:  2015/03/13                              DISCHARGE SUMMARY   DEATH SUMMARY  This is a death summary that I am dictating for Dr. Evalyn Casco and Dr. Marchelle Gearing.  I have never examined or managed this patient.  This is a 80 year old male with coronary artery disease, diabetes, who was brought to [facility] on February 13, 2015, with acute change in mental status, found to have severe acute renal failure with hyperkalemia greater than 7.5 and creatinine 17.4.  The patient required intubation secondary to respiratory failure as well as HD catheter placement.  The patient was noted to have multiple medical problems, coronary artery disease, diabetes, hypothyroidism, there was limited history provided.  The patient required sedation for ventilatory support.  Had serial troponins and lactic acids assessed.  Renal was consulted for CVVHD.  Unfortunately, the patient suffered a cardiac arrest.  To note, the patient's initial pH was 6.9, it then improved to 7.28.  Subsequent potassium levels also have improved.  Troponin was elevated at 6.5, and lactic acid was elevated at 8.6.  The patient had normal Curci blood cell count, was mildly anemic, and had some relative thrombocytopenia.  The patient suffered cardiac arrest.  To note, the patient had central line placement, intubation.  The patient then went into a wide-complex rhythm with bradycardia, was pushed with bicarb and other treatment options for presumed hyperkalemia, was resuscitated. Was not a candidate per Dr. Marchelle Gearing for hypothermia.  He was re- rounded upon and managed by Dr. Curt Bears at 2:35 a.m.  There was concerns for sepsis.  Pressors are being  required to support blood pressure.  Bicarbonate infusion was being utilized again for hyperkalemia concerns.  He has been treated with vancomycin and Zosyn. Unfortunately, despite aggressive efforts and repeat cardiac arrest, the patient was unable to be resuscitated.  DNR was established and the patient expired.  FINAL DIAGNOSES UPON DEATH: 1. Acute renal failure with hyperkalemia. 2. Status post cardiac arrest most likely secondary to metabolic     derangement such as hyperkalemia. 3. Rule out pneumonia. 4. Rule out sepsis. 5. Acute respiratory failure. 6. Rule out active coronary ischemia.    Nelda Bucks, MD    DJF/MEDQ  D:  03/17/2015  T:  03/18/2015  Job:  [id number]

## 2016-04-25 IMAGING — CR DG CHEST 1V PORT
1 series · 1 of 1 positions shown · non-contrast
Comparison: Chest radiograph performed 02/13/2015

CLINICAL DATA: Status post code blue. Respiratory failure. Initial
encounter.

EXAM:
PORTABLE CHEST - 1 VIEW

[AP]
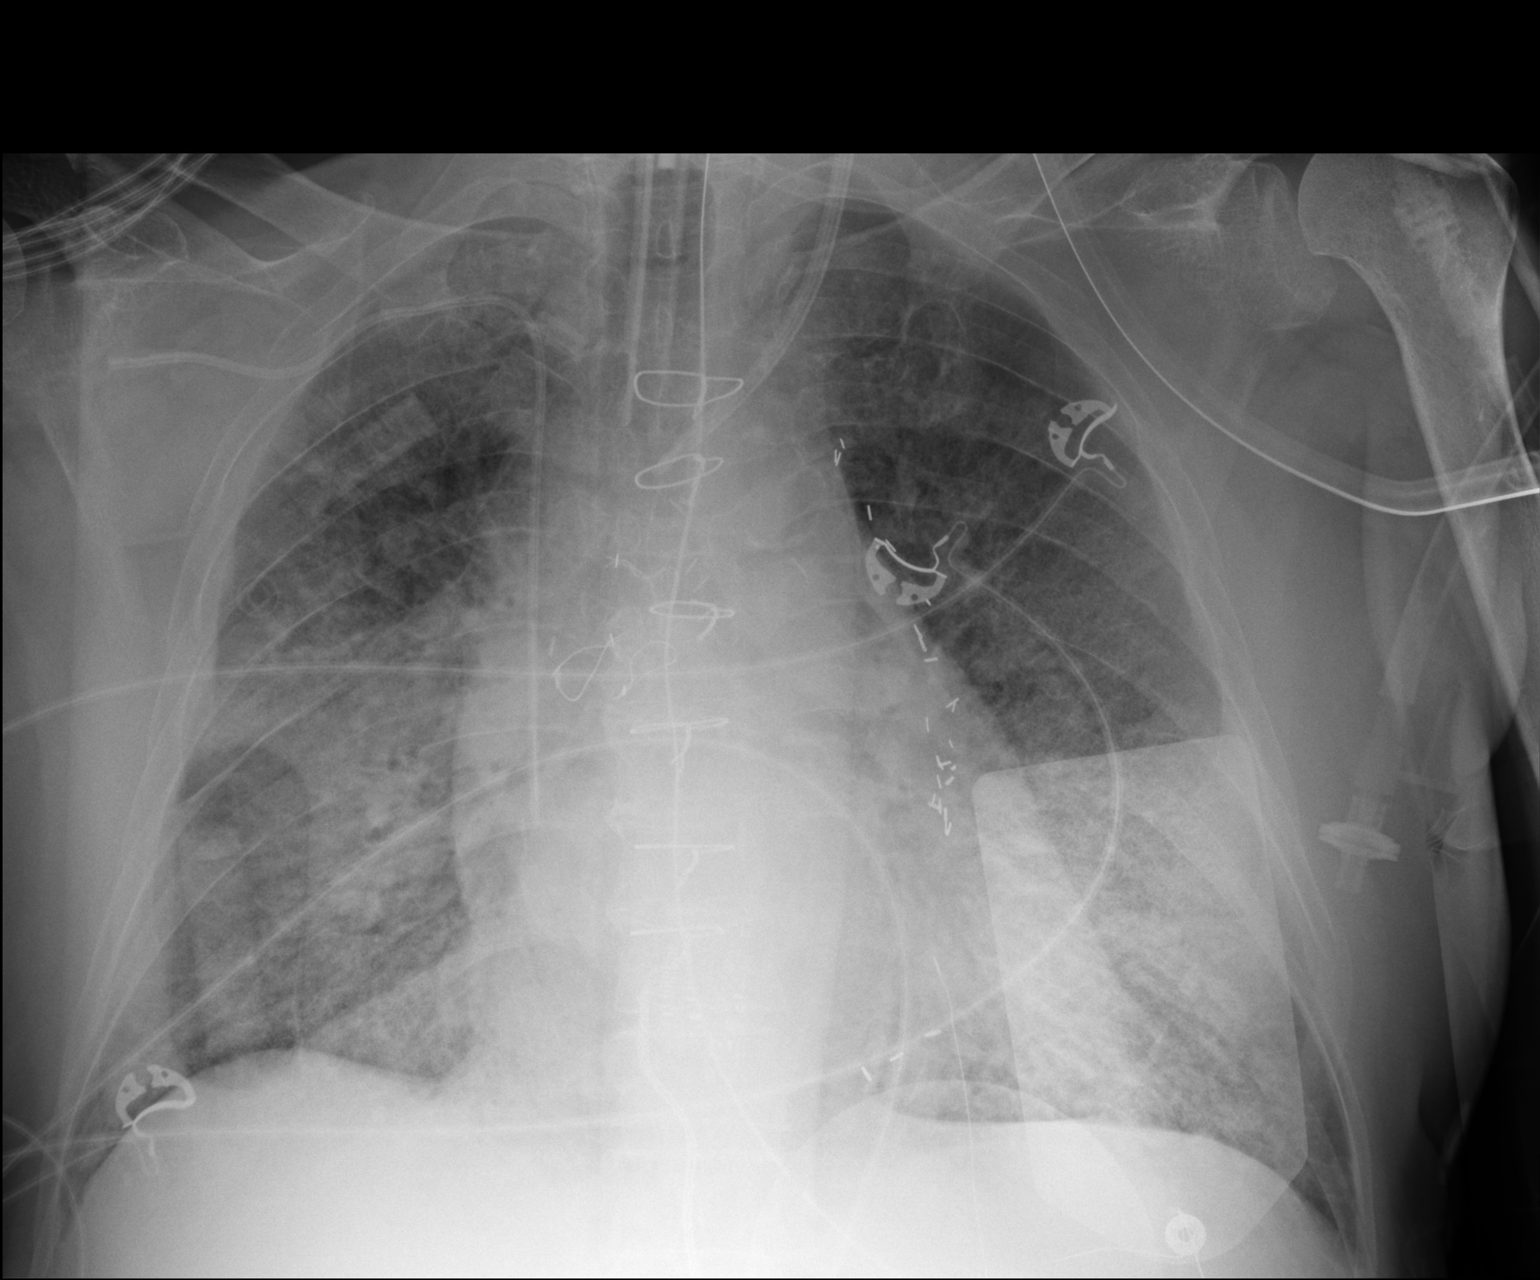

[1 of 1 positions shown; findings below may reference images not displayed]

FINDINGS: The patient's endotracheal tube is seen ending 4 cm above the
carina. A right subclavian line is noted ending about the cavoatrial
junction. An enteric tube is noted extending below the diaphragm.

Lung expansion is improved. Diffuse bilateral airspace opacification
likely reflects pulmonary edema, given rapid onset, though pneumonia
could conceivably have a similar appearance. No pleural effusion or
pneumothorax is seen.

The cardiomediastinal silhouette is borderline enlarged. There
appears to be a new fracture of the left lateral seventh rib. No
definite additional rib fractures are characterized. An external
pacing pad is noted.
IMPRESSION: 1. Endotracheal tube seen ending 4 cm above the carina.
2. Improved lung expansion. New diffuse bilateral airspace
opacification likely reflects pulmonary edema, given rapid onset,
though pneumonia could conceivably have a similar appearance.
3. Borderline cardiomegaly noted.
4. New fracture of the left lateral seventh rib.
